# Patient Record
Sex: Female | Born: 1980 | Race: White | Hispanic: No | Marital: Married | Smoking: Never smoker
Health system: Southern US, Community
[De-identification: ages and names within clinical notes are randomized; demographics above are authoritative.]

## PROBLEM LIST (undated history)

## (undated) DIAGNOSIS — Z8619 Personal history of other infectious and parasitic diseases: Secondary | ICD-10-CM

## (undated) DIAGNOSIS — L409 Psoriasis, unspecified: Secondary | ICD-10-CM

## (undated) HISTORY — PX: WISDOM TOOTH EXTRACTION: SHX21

## (undated) HISTORY — DX: Personal history of other infectious and parasitic diseases: Z86.19

---

## 2003-06-30 ENCOUNTER — Other Ambulatory Visit: Admission: RE | Admit: 2003-06-30 | Discharge: 2003-06-30 | Payer: Self-pay | Admitting: Obstetrics and Gynecology

## 2005-03-21 ENCOUNTER — Other Ambulatory Visit: Admission: RE | Admit: 2005-03-21 | Discharge: 2005-03-21 | Payer: Self-pay | Admitting: Obstetrics and Gynecology

## 2009-12-22 ENCOUNTER — Emergency Department (HOSPITAL_BASED_OUTPATIENT_CLINIC_OR_DEPARTMENT_OTHER): Admission: EM | Admit: 2009-12-22 | Discharge: 2009-12-23 | Payer: Self-pay | Admitting: Emergency Medicine

## 2010-11-05 LAB — OB RESULTS CONSOLE HEPATITIS B SURFACE ANTIGEN: Hepatitis B Surface Ag: NEGATIVE

## 2010-11-05 LAB — OB RESULTS CONSOLE GC/CHLAMYDIA: Chlamydia: NEGATIVE

## 2010-11-05 LAB — OB RESULTS CONSOLE ANTIBODY SCREEN: Antibody Screen: NEGATIVE

## 2010-11-05 LAB — OB RESULTS CONSOLE RUBELLA ANTIBODY, IGM: Rubella: IMMUNE

## 2011-06-15 ENCOUNTER — Telehealth (HOSPITAL_COMMUNITY): Payer: Self-pay | Admitting: *Deleted

## 2011-06-15 ENCOUNTER — Encounter (HOSPITAL_COMMUNITY): Payer: Self-pay | Admitting: *Deleted

## 2011-06-15 NOTE — Telephone Encounter (Signed)
Preadmission screen  

## 2011-06-21 ENCOUNTER — Inpatient Hospital Stay (HOSPITAL_COMMUNITY): Admission: RE | Admit: 2011-06-21 | Payer: Self-pay | Source: Ambulatory Visit

## 2011-06-21 ENCOUNTER — Telehealth (HOSPITAL_COMMUNITY): Payer: Self-pay | Admitting: *Deleted

## 2011-06-21 NOTE — Telephone Encounter (Signed)
Preadmission screen  

## 2011-06-23 ENCOUNTER — Inpatient Hospital Stay (HOSPITAL_COMMUNITY): Admission: RE | Admit: 2011-06-23 | Payer: Self-pay | Source: Ambulatory Visit

## 2011-06-25 ENCOUNTER — Encounter (HOSPITAL_COMMUNITY): Payer: Self-pay | Admitting: *Deleted

## 2011-06-25 ENCOUNTER — Inpatient Hospital Stay (HOSPITAL_COMMUNITY)
Admission: AD | Admit: 2011-06-25 | Discharge: 2011-06-29 | DRG: 371 | Disposition: A | Discharge status: Home / Self Care | Payer: BC Managed Care – PPO | Source: Ambulatory Visit | Attending: Obstetrics and Gynecology | Admitting: Obstetrics and Gynecology

## 2011-06-25 DIAGNOSIS — O324XX0 Maternal care for high head at term, not applicable or unspecified: Secondary | ICD-10-CM | POA: Diagnosis present

## 2011-06-25 LAB — CBC
Hemoglobin: 12.4 g/dL (ref 12.0–15.0)
MCH: 30.7 pg (ref 26.0–34.0)
RBC: 4.04 MIL/uL (ref 3.87–5.11)
WBC: 15.5 10*3/uL — ABNORMAL HIGH (ref 4.0–10.5)

## 2011-06-25 LAB — RPR: RPR Ser Ql: NONREACTIVE

## 2011-06-25 MED ORDER — ONDANSETRON HCL 4 MG/2ML IJ SOLN
4.0000 mg | Freq: Four times a day (QID) | INTRAMUSCULAR | Status: DC | PRN
  Administered 2011-06-25: 4 mg via INTRAVENOUS
  Filled 2011-06-25: qty 2

## 2011-06-25 MED ORDER — OXYCODONE-ACETAMINOPHEN 5-325 MG PO TABS: 1.0000 | ORAL_TABLET | ORAL | Status: DC | PRN

## 2011-06-25 MED ORDER — CITRIC ACID-SODIUM CITRATE 334-500 MG/5ML PO SOLN
30.0000 mL | ORAL | Status: DC | PRN
  Administered 2011-06-26: 30 mL via ORAL
  Filled 2011-06-25: qty 15

## 2011-06-25 MED ORDER — PHENYLEPHRINE 40 MCG/ML (10ML) SYRINGE FOR IV PUSH (FOR BLOOD PRESSURE SUPPORT)
80.0000 ug | PREFILLED_SYRINGE | INTRAVENOUS | Status: DC | PRN
  Filled 2011-06-25: qty 5

## 2011-06-25 MED ORDER — LACTATED RINGERS IV SOLN: 500.0000 mL | INTRAVENOUS | Status: DC | PRN

## 2011-06-25 MED ORDER — FENTANYL 2.5 MCG/ML BUPIVACAINE 1/10 % EPIDURAL INFUSION (WH - ANES)
14.0000 mL/h | INTRAMUSCULAR | Status: DC
  Administered 2011-06-25 – 2011-06-26 (×3): 14 mL/h via EPIDURAL
  Filled 2011-06-25 (×4): qty 60

## 2011-06-25 MED ORDER — DIPHENHYDRAMINE HCL 50 MG/ML IJ SOLN: 12.5000 mg | INTRAMUSCULAR | Status: DC | PRN

## 2011-06-25 MED ORDER — EPHEDRINE 5 MG/ML INJ: 10.0000 mg | INTRAVENOUS | Status: DC | PRN

## 2011-06-25 MED ORDER — LIDOCAINE HCL (PF) 1 % IJ SOLN
30.0000 mL | INTRAMUSCULAR | Status: DC | PRN
  Filled 2011-06-25: qty 30

## 2011-06-25 MED ORDER — ACETAMINOPHEN 325 MG PO TABS: 650.0000 mg | ORAL_TABLET | ORAL | Status: DC | PRN

## 2011-06-25 MED ORDER — OXYTOCIN 20 UNITS IN LACTATED RINGERS INFUSION - SIMPLE: 125.0000 mL/h | Freq: Once | INTRAVENOUS | Status: DC

## 2011-06-25 MED ORDER — OXYTOCIN BOLUS FROM INFUSION
500.0000 mL | Freq: Once | INTRAVENOUS | Status: DC
  Filled 2011-06-25: qty 500
  Filled 2011-06-25: qty 1000

## 2011-06-25 MED ORDER — IBUPROFEN 600 MG PO TABS: 600.0000 mg | ORAL_TABLET | Freq: Four times a day (QID) | ORAL | Status: DC | PRN

## 2011-06-25 MED ORDER — EPHEDRINE 5 MG/ML INJ
10.0000 mg | INTRAVENOUS | Status: DC | PRN
  Filled 2011-06-25: qty 4

## 2011-06-25 MED ORDER — FLEET ENEMA 7-19 GM/118ML RE ENEM: 1.0000 | ENEMA | RECTAL | Status: DC | PRN

## 2011-06-25 MED ORDER — PHENYLEPHRINE 40 MCG/ML (10ML) SYRINGE FOR IV PUSH (FOR BLOOD PRESSURE SUPPORT): 80.0000 ug | PREFILLED_SYRINGE | INTRAVENOUS | Status: DC | PRN

## 2011-06-25 MED ORDER — FENTANYL 2.5 MCG/ML BUPIVACAINE 1/10 % EPIDURAL INFUSION (WH - ANES)
INTRAMUSCULAR | Status: DC | PRN
  Administered 2011-06-25: 14 mL/h via EPIDURAL

## 2011-06-25 MED ORDER — SODIUM BICARBONATE 8.4 % IV SOLN
INTRAVENOUS | Status: DC | PRN
  Administered 2011-06-25: 4 mL via EPIDURAL

## 2011-06-25 MED ORDER — LACTATED RINGERS IV SOLN
500.0000 mL | Freq: Once | INTRAVENOUS | Status: AC
  Administered 2011-06-25: 1000 mL via INTRAVENOUS

## 2011-06-25 MED ORDER — LACTATED RINGERS IV SOLN
INTRAVENOUS | Status: DC
  Administered 2011-06-25 (×2): via INTRAVENOUS

## 2011-06-25 NOTE — Progress Notes (Signed)
Dr Marcelle Overlie made aware of pt status: FHT tracing, uterine contraction pattern, pain level, intact membrane status, SVE and fetal station. Will continue to monitor.

## 2011-06-25 NOTE — Anesthesia Preprocedure Evaluation (Addendum)

## 2011-06-25 NOTE — Anesthesia Procedure Notes (Signed)

## 2011-06-25 NOTE — H&P (Signed)
Elizabeth Glass is a 31 y.o. female presenting for labor. Maternal Medical History:  Reason for admission: Reason for admission: contractions.  Contractions: Onset was 3-5 hours ago.   Frequency: regular.   Perceived severity is moderate.    Fetal activity: Perceived fetal activity is normal.      OB History    Grav Para Term Preterm Abortions TAB SAB Ect Mult Living   1              Past Medical History  Diagnosis Date  . History of chicken pox    Past Surgical History  Procedure Date  . Wisdom tooth extraction    Family History: family history includes Celiac disease in her paternal aunt. Social History:  reports that she has never smoked. She has never used smokeless tobacco. She reports that she does not drink alcohol or use illicit drugs.  ROS  Dilation: 4.5 Effacement (%): 100 Station: 0 Exam by:: k fields, rn Blood pressure 136/89, pulse 82, temperature 98.6 F (37 C), temperature source Oral, resp. rate 20, height 5\' 4"  (1.626 m), weight 151 lb (68.493 kg), last menstrual period 09/10/2010. Maternal Exam:  Uterine Assessment: Contraction strength is moderate.  Contraction frequency is regular.   Abdomen: Fundal height is term FH.   Estimated fetal weight is AGA.   Fetal presentation: vertex  Introitus: Normal vulva. Normal vagina.  Pelvis: adequate for delivery.   Cervix: Cervix evaluated by digital exam.     Physical Exam  Constitutional: She is oriented to person, place, and time. She appears well-developed and well-nourished.  HENT:  Head: Normocephalic and atraumatic.  Neck: Normal range of motion. Neck supple.  Cardiovascular: Normal rate and regular rhythm.   Respiratory: Effort normal and breath sounds normal.  GI:       Term FH, FHR 146  Genitourinary:       cx 4-5  Musculoskeletal: Normal range of motion.  Neurological: She is alert and oriented to person, place, and time.    Prenatal labs: ABO, Rh: O/Positive/-- (09/28 0000) Antibody:  Negative (09/28 0000) Rubella: Immune (09/28 0000) RPR: Nonreactive (09/28 0000)  HBsAg: Negative (09/28 0000)  HIV: Non-reactive (09/28 0000)  GBS: Negative (04/15 0000)   Assessment/Plan: Term IUP, labor, for epidural   Mickel Schreur M 06/25/2011, 3:22 PM

## 2011-06-26 ENCOUNTER — Encounter (HOSPITAL_COMMUNITY): Payer: Self-pay | Admitting: *Deleted

## 2011-06-26 ENCOUNTER — Inpatient Hospital Stay (HOSPITAL_COMMUNITY): Payer: BC Managed Care – PPO | Admitting: Anesthesiology

## 2011-06-26 ENCOUNTER — Encounter (HOSPITAL_COMMUNITY)
Admission: AD | Disposition: A | Discharge status: Home / Self Care | Payer: Self-pay | Source: Ambulatory Visit | Attending: Obstetrics and Gynecology

## 2011-06-26 ENCOUNTER — Encounter (HOSPITAL_COMMUNITY): Payer: Self-pay | Admitting: Anesthesiology

## 2011-06-26 SURGERY — Surgical Case
Anesthesia: Epidural | Site: Abdomen | Wound class: Clean Contaminated

## 2011-06-26 MED ORDER — DIPHENHYDRAMINE HCL 50 MG/ML IJ SOLN: 12.5000 mg | INTRAMUSCULAR | Status: DC | PRN

## 2011-06-26 MED ORDER — ONDANSETRON HCL 4 MG/2ML IJ SOLN
INTRAMUSCULAR | Status: AC
  Filled 2011-06-26: qty 4

## 2011-06-26 MED ORDER — METOCLOPRAMIDE HCL 5 MG/ML IJ SOLN: 10.0000 mg | Freq: Three times a day (TID) | INTRAMUSCULAR | Status: DC | PRN

## 2011-06-26 MED ORDER — 0.9 % SODIUM CHLORIDE (POUR BTL) OPTIME
TOPICAL | Status: DC | PRN
  Administered 2011-06-26: 1000 mL

## 2011-06-26 MED ORDER — PHENYLEPHRINE HCL 10 MG/ML IJ SOLN
INTRAMUSCULAR | Status: DC | PRN
  Administered 2011-06-26: 40 ug via INTRAVENOUS
  Administered 2011-06-26: 80 ug via INTRAVENOUS

## 2011-06-26 MED ORDER — KETOROLAC TROMETHAMINE 30 MG/ML IJ SOLN: 15.0000 mg | Freq: Once | INTRAMUSCULAR | Status: DC | PRN

## 2011-06-26 MED ORDER — ZOLPIDEM TARTRATE 5 MG PO TABS: 5.0000 mg | ORAL_TABLET | Freq: Every evening | ORAL | Status: DC | PRN

## 2011-06-26 MED ORDER — TETANUS-DIPHTH-ACELL PERTUSSIS 5-2.5-18.5 LF-MCG/0.5 IM SUSP: 0.5000 mL | Freq: Once | INTRAMUSCULAR | Status: DC

## 2011-06-26 MED ORDER — OXYTOCIN 10 UNIT/ML IJ SOLN
INTRAMUSCULAR | Status: AC
  Filled 2011-06-26: qty 8

## 2011-06-26 MED ORDER — NALBUPHINE HCL 10 MG/ML IJ SOLN
5.0000 mg | INTRAMUSCULAR | Status: DC | PRN
  Filled 2011-06-26: qty 1

## 2011-06-26 MED ORDER — KETOROLAC TROMETHAMINE 30 MG/ML IJ SOLN: 30.0000 mg | Freq: Four times a day (QID) | INTRAMUSCULAR | Status: AC | PRN

## 2011-06-26 MED ORDER — SODIUM CHLORIDE 0.9 % IJ SOLN: 3.0000 mL | INTRAMUSCULAR | Status: DC | PRN

## 2011-06-26 MED ORDER — DIBUCAINE 1 % RE OINT: 1.0000 "application " | TOPICAL_OINTMENT | RECTAL | Status: DC | PRN

## 2011-06-26 MED ORDER — BISACODYL 10 MG RE SUPP: 10.0000 mg | Freq: Every day | RECTAL | Status: DC | PRN

## 2011-06-26 MED ORDER — KETOROLAC TROMETHAMINE 60 MG/2ML IM SOLN
60.0000 mg | Freq: Once | INTRAMUSCULAR | Status: AC | PRN
  Administered 2011-06-26: 60 mg via INTRAMUSCULAR

## 2011-06-26 MED ORDER — LIDOCAINE-EPINEPHRINE (PF) 2 %-1:200000 IJ SOLN
INTRAMUSCULAR | Status: AC
  Filled 2011-06-26: qty 20

## 2011-06-26 MED ORDER — IBUPROFEN 800 MG PO TABS
800.0000 mg | ORAL_TABLET | Freq: Three times a day (TID) | ORAL | Status: DC | PRN
  Administered 2011-06-26 – 2011-06-29 (×9): 800 mg via ORAL
  Filled 2011-06-26 (×9): qty 1

## 2011-06-26 MED ORDER — DIPHENHYDRAMINE HCL 50 MG/ML IJ SOLN: 25.0000 mg | INTRAMUSCULAR | Status: DC | PRN

## 2011-06-26 MED ORDER — LACTATED RINGERS IV SOLN: INTRAVENOUS | Status: DC | PRN

## 2011-06-26 MED ORDER — MEPERIDINE HCL 25 MG/ML IJ SOLN
INTRAMUSCULAR | Status: DC | PRN
  Administered 2011-06-26 (×2): 12.5 mg via INTRAVENOUS

## 2011-06-26 MED ORDER — MENTHOL 3 MG MT LOZG: 1.0000 | LOZENGE | OROMUCOSAL | Status: DC | PRN

## 2011-06-26 MED ORDER — OXYCODONE-ACETAMINOPHEN 5-325 MG PO TABS
1.0000 | ORAL_TABLET | Freq: Four times a day (QID) | ORAL | Status: DC | PRN
  Administered 2011-06-26 – 2011-06-29 (×11): 1 via ORAL
  Filled 2011-06-26 (×11): qty 1

## 2011-06-26 MED ORDER — PRENATAL MULTIVITAMIN CH
1.0000 | ORAL_TABLET | Freq: Every day | ORAL | Status: DC
  Administered 2011-06-27: 1 via ORAL
  Filled 2011-06-26 (×2): qty 1

## 2011-06-26 MED ORDER — FENTANYL CITRATE 0.05 MG/ML IJ SOLN
INTRAMUSCULAR | Status: AC
  Filled 2011-06-26: qty 4

## 2011-06-26 MED ORDER — SODIUM CHLORIDE 0.9 % IV SOLN
1.0000 ug/kg/h | INTRAVENOUS | Status: DC | PRN
  Filled 2011-06-26: qty 2.5

## 2011-06-26 MED ORDER — SODIUM BICARBONATE 8.4 % IV SOLN
INTRAVENOUS | Status: AC
  Filled 2011-06-26: qty 50

## 2011-06-26 MED ORDER — OXYTOCIN 10 UNIT/ML IJ SOLN
INTRAMUSCULAR | Status: DC | PRN
  Administered 2011-06-26 (×2): 20 [IU] via INTRAMUSCULAR

## 2011-06-26 MED ORDER — MORPHINE SULFATE (PF) 0.5 MG/ML IJ SOLN
INTRAMUSCULAR | Status: DC | PRN
  Administered 2011-06-26: 2 mg via INTRAVENOUS

## 2011-06-26 MED ORDER — HYDROMORPHONE HCL PF 1 MG/ML IJ SOLN: 0.2500 mg | INTRAMUSCULAR | Status: DC | PRN

## 2011-06-26 MED ORDER — WITCH HAZEL-GLYCERIN EX PADS: 1.0000 "application " | MEDICATED_PAD | CUTANEOUS | Status: DC | PRN

## 2011-06-26 MED ORDER — ONDANSETRON HCL 4 MG/2ML IJ SOLN: 4.0000 mg | INTRAMUSCULAR | Status: DC | PRN

## 2011-06-26 MED ORDER — FENTANYL CITRATE 0.05 MG/ML IJ SOLN
INTRAMUSCULAR | Status: DC | PRN
  Administered 2011-06-26 (×2): 50 ug via INTRAVENOUS

## 2011-06-26 MED ORDER — SIMETHICONE 80 MG PO CHEW: 80.0000 mg | CHEWABLE_TABLET | ORAL | Status: DC | PRN

## 2011-06-26 MED ORDER — MORPHINE SULFATE (PF) 0.5 MG/ML IJ SOLN
INTRAMUSCULAR | Status: DC | PRN
  Administered 2011-06-26: 3 mg via EPIDURAL

## 2011-06-26 MED ORDER — MEPERIDINE HCL 25 MG/ML IJ SOLN
INTRAMUSCULAR | Status: AC
  Filled 2011-06-26: qty 1

## 2011-06-26 MED ORDER — MEASLES, MUMPS & RUBELLA VAC ~~LOC~~ INJ
0.5000 mL | INJECTION | Freq: Once | SUBCUTANEOUS | Status: DC
  Filled 2011-06-26: qty 0.5

## 2011-06-26 MED ORDER — DIPHENHYDRAMINE HCL 25 MG PO CAPS: 25.0000 mg | ORAL_CAPSULE | Freq: Four times a day (QID) | ORAL | Status: DC | PRN

## 2011-06-26 MED ORDER — NALBUPHINE HCL 10 MG/ML IJ SOLN
5.0000 mg | INTRAMUSCULAR | Status: DC | PRN
Start: 2011-06-26 — End: 2011-06-29
  Filled 2011-06-26: qty 1

## 2011-06-26 MED ORDER — SCOPOLAMINE 1 MG/3DAYS TD PT72: 1.0000 | MEDICATED_PATCH | Freq: Once | TRANSDERMAL | Status: DC

## 2011-06-26 MED ORDER — LANOLIN HYDROUS EX OINT: 1.0000 "application " | TOPICAL_OINTMENT | CUTANEOUS | Status: DC | PRN

## 2011-06-26 MED ORDER — PROMETHAZINE HCL 25 MG/ML IJ SOLN: 6.2500 mg | INTRAMUSCULAR | Status: DC | PRN

## 2011-06-26 MED ORDER — SENNOSIDES-DOCUSATE SODIUM 8.6-50 MG PO TABS
2.0000 | ORAL_TABLET | Freq: Every day | ORAL | Status: DC
  Administered 2011-06-26 – 2011-06-28 (×3): 2 via ORAL

## 2011-06-26 MED ORDER — ONDANSETRON HCL 4 MG PO TABS: 4.0000 mg | ORAL_TABLET | ORAL | Status: DC | PRN

## 2011-06-26 MED ORDER — OXYTOCIN 20 UNITS IN LACTATED RINGERS INFUSION - SIMPLE: 125.0000 mL/h | INTRAVENOUS | Status: AC

## 2011-06-26 MED ORDER — FLEET ENEMA 7-19 GM/118ML RE ENEM: 1.0000 | ENEMA | Freq: Every day | RECTAL | Status: DC | PRN

## 2011-06-26 MED ORDER — KETOROLAC TROMETHAMINE 60 MG/2ML IM SOLN
INTRAMUSCULAR | Status: AC
  Filled 2011-06-26: qty 2

## 2011-06-26 MED ORDER — CEFAZOLIN SODIUM 1-5 GM-% IV SOLN
1.0000 g | Freq: Once | INTRAVENOUS | Status: AC
  Administered 2011-06-26: 1 g via INTRAVENOUS
  Filled 2011-06-26: qty 50

## 2011-06-26 MED ORDER — MORPHINE SULFATE 0.5 MG/ML IJ SOLN
INTRAMUSCULAR | Status: AC
  Filled 2011-06-26: qty 20

## 2011-06-26 MED ORDER — LACTATED RINGERS IV SOLN
INTRAVENOUS | Status: DC | PRN
  Administered 2011-06-26 (×3): via INTRAVENOUS

## 2011-06-26 MED ORDER — CEFAZOLIN SODIUM 1-5 GM-% IV SOLN
INTRAVENOUS | Status: AC
  Filled 2011-06-26: qty 50

## 2011-06-26 MED ORDER — ONDANSETRON HCL 4 MG/2ML IJ SOLN: 4.0000 mg | Freq: Three times a day (TID) | INTRAMUSCULAR | Status: DC | PRN

## 2011-06-26 MED ORDER — SODIUM CHLORIDE 0.9 % IJ SOLN
3.0000 mL | INTRAMUSCULAR | Status: DC | PRN
  Administered 2011-06-26: 3 mL via INTRAVENOUS

## 2011-06-26 MED ORDER — PHENYLEPHRINE 40 MCG/ML (10ML) SYRINGE FOR IV PUSH (FOR BLOOD PRESSURE SUPPORT)
PREFILLED_SYRINGE | INTRAVENOUS | Status: AC
  Filled 2011-06-26: qty 20

## 2011-06-26 MED ORDER — MEPERIDINE HCL 25 MG/ML IJ SOLN: 6.2500 mg | INTRAMUSCULAR | Status: DC | PRN

## 2011-06-26 MED ORDER — DIPHENHYDRAMINE HCL 25 MG PO CAPS
25.0000 mg | ORAL_CAPSULE | ORAL | Status: DC | PRN
  Filled 2011-06-26: qty 1

## 2011-06-26 MED ORDER — SODIUM CHLORIDE 0.9 % IV SOLN
250.0000 mL | INTRAVENOUS | Status: DC
Start: 2011-06-26 — End: 2011-06-29

## 2011-06-26 MED ORDER — NALOXONE HCL 0.4 MG/ML IJ SOLN: 0.4000 mg | INTRAMUSCULAR | Status: DC | PRN

## 2011-06-26 MED ORDER — SODIUM CHLORIDE 0.9 % IJ SOLN
3.0000 mL | Freq: Two times a day (BID) | INTRAMUSCULAR | Status: DC
  Administered 2011-06-26: 3 mL via INTRAVENOUS

## 2011-06-26 MED ORDER — SIMETHICONE 80 MG PO CHEW
80.0000 mg | CHEWABLE_TABLET | Freq: Three times a day (TID) | ORAL | Status: DC
  Administered 2011-06-26 – 2011-06-28 (×9): 80 mg via ORAL

## 2011-06-26 SURGICAL SUPPLY — 26 items
CLOTH BEACON ORANGE TIMEOUT ST (SAFETY) ×2 IMPLANT
DRESSING TELFA 8X3 (GAUZE/BANDAGES/DRESSINGS) IMPLANT
DRSG COVADERM 4X10 (GAUZE/BANDAGES/DRESSINGS) ×1 IMPLANT
ELECT REM PT RETURN 9FT ADLT (ELECTROSURGICAL) ×2
ELECTRODE REM PT RTRN 9FT ADLT (ELECTROSURGICAL) ×1 IMPLANT
EXTRACTOR VACUUM M CUP 4 TUBE (SUCTIONS) IMPLANT
GAUZE SPONGE 4X4 12PLY STRL LF (GAUZE/BANDAGES/DRESSINGS) ×2 IMPLANT
GLOVE BIO SURGEON STRL SZ7 (GLOVE) ×4 IMPLANT
GOWN PREVENTION PLUS LG XLONG (DISPOSABLE) ×6 IMPLANT
KIT ABG SYR 3ML LUER SLIP (SYRINGE) ×1 IMPLANT
NDL HYPO 25X5/8 SAFETYGLIDE (NEEDLE) ×1 IMPLANT
NEEDLE HYPO 25X5/8 SAFETYGLIDE (NEEDLE) ×2 IMPLANT
NS IRRIG 1000ML POUR BTL (IV SOLUTION) ×2 IMPLANT
PACK C SECTION WH (CUSTOM PROCEDURE TRAY) ×2 IMPLANT
PAD ABD 7.5X8 STRL (GAUZE/BANDAGES/DRESSINGS) ×2 IMPLANT
SLEEVE SCD COMPRESS KNEE MED (MISCELLANEOUS) ×1 IMPLANT
STRIP CLOSURE SKIN 1/2X4 (GAUZE/BANDAGES/DRESSINGS) ×1 IMPLANT
SUT CHROMIC 0 CTX 36 (SUTURE) ×6 IMPLANT
SUT MON AB 4-0 PS1 27 (SUTURE) ×2 IMPLANT
SUT PDS AB 0 CT1 27 (SUTURE) ×4 IMPLANT
SUT VIC AB 3-0 CT1 27 (SUTURE) ×4
SUT VIC AB 3-0 CT1 TAPERPNT 27 (SUTURE) ×2 IMPLANT
TAPE CLOTH SURG 4X10 WHT LF (GAUZE/BANDAGES/DRESSINGS) ×1 IMPLANT
TOWEL OR 17X24 6PK STRL BLUE (TOWEL DISPOSABLE) ×4 IMPLANT
TRAY FOLEY CATH 14FR (SET/KITS/TRAYS/PACK) ×1 IMPLANT
WATER STERILE IRR 1000ML POUR (IV SOLUTION) ×2 IMPLANT

## 2011-06-26 NOTE — Anesthesia Postprocedure Evaluation (Signed)
  Anesthesia Post-op Note  Patient: Elizabeth Glass  Procedure(s) Performed: Procedure(s) (LRB): CESAREAN SECTION (N/A)  Patient Location: PACU and Mother/Baby  Anesthesia Type: Epidural  Level of Consciousness: awake, alert  and oriented  Airway and Oxygen Therapy: Patient Spontanous Breathing   Post-op Assessment: Patient's Cardiovascular Status Stable and Respiratory Function Stable  Post-op Vital Signs: stable  Complications: No apparent anesthesia complications

## 2011-06-26 NOTE — Progress Notes (Signed)
Nursery called to see if baby and daddy could come for a visit, asked mom who states she is still very tired and wants to hold off of seeing baby right now, husband in to see pt and update given, will continue to monitor and manage, encouraged skin to skin in nursery with father.

## 2011-06-26 NOTE — Op Note (Signed)
Preoperative diagnosis: Failure to descend  Postoperative diagnosis: Same  Procedure: Primary low transverse cesarean section  Surgeon: Marcelle Overlie  EBL: 700 cc  Complications: None  Specimens removed: Placenta to labor and delivery  Procedure and findings:  Patient was taken to the operating room after an adequate level of epidural anesthesia was obtained surgeon's hand used to elevate the vertex out of the pelvis preoperatively. Appropriate time out was performed she did receive Ancef 1 g IV preop Pfannenstiel incision was made 2 finger breaths above the symphysis carried down to the fascia which was incised and extended transversely. Rectus muscle divided in the midline peritoneum entered superiorly without incident and extended in a vertical fashion. The vesicouterine serosa was incised and the bladder was bluntly and sharply dissected below, bladder blade repositioned. Transverse incision made in the lower segment extended with blunt dissection clear fluid noted the patient then delivered of a healthy female Apgars 9 and 9 the infant was suctioned cord clamped and passed the pediatric team for further care cord pH was sent placenta was removed manually intact, uterus exteriorized cavity wiped clean with a laparotomy pack closure obtained the first layer of 0 chromic in a locked fashion followed by an imbricating layer of chromic. This was hemostatic the bladder flap area was intact and hemostatic. Bilateral tubes and ovaries normal. Prior to closure sponge needle instrument counts reported as correct x2. Peritoneum closed with a running 2-0 Vicryl suture. Fascia closed from laterally to midline on either side with a 0 PDS suture. Subcutaneous tissue was minimal and hemostatic 4-0 Monocryl subcuticular closure. Mother and baby doing well at that point.  Dictated with [facility]  Porter Moes M. Milana Obey.D.

## 2011-06-26 NOTE — Progress Notes (Signed)
Pushing ~ 2 hrs +, now +2, LOA with stable FHR, does have descent with pushing effort, she is still willing to push

## 2011-06-26 NOTE — OR Nursing (Signed)
Uterus massaged by S. Paradise Vensel RN. Two tubes of cord blood to lab. Foley catheter in place upon arrival to OR. Urine color-slightly concentrated. 

## 2011-06-26 NOTE — Anesthesia Postprocedure Evaluation (Signed)
Anesthesia Post Note  Patient: Elizabeth Glass  Procedure(s) Performed: Procedure(s) (LRB): CESAREAN SECTION (N/A)  Anesthesia type: Epidural  Patient location: PACU  Post pain: Pain level controlled  Post assessment: Post-op Vital signs reviewed  Last Vitals:  Filed Vitals:   06/26/11 0830  BP: 122/66  Pulse: 79  Temp:   Resp: 19    Post vital signs: Reviewed  Level of consciousness: awake  Complications: No apparent anesthesia complications

## 2011-06-26 NOTE — Transfer of Care (Signed)
Immediate Anesthesia Transfer of Care Note  Patient: Elizabeth Glass  Procedure(s) Performed: Procedure(s) (LRB): CESAREAN SECTION (N/A)  Patient Location: PACU  Anesthesia Type: Epidural  Level of Consciousness: awake, alert  and oriented  Airway & Oxygen Therapy: Patient Spontanous Breathing  Post-op Assessment: Report given to PACU RN and Post -op Vital signs reviewed and stable  Post vital signs: stable  Complications: No apparent anesthesia complications

## 2011-06-26 NOTE — Progress Notes (Signed)
AROM>>clear AF, complete. LOA @ 0-+1, will start pushing when pt gets sensation, stable FHR

## 2011-06-26 NOTE — Progress Notes (Signed)
MD made aware of pushing time, SVE, including station and progression. FHT tracing and uterine contraction pattern. Request to evaluate in person.

## 2011-06-26 NOTE — Progress Notes (Signed)
Rechecked, no change past +2 with ^ caput, rec CS for failure to descend, procedure reviewed

## 2011-06-26 NOTE — Progress Notes (Signed)
In or via stretcher

## 2011-06-26 NOTE — Addendum Note (Signed)
Addendum  created 06/26/11 1920 by Len Blalock, CRNA   Modules edited:Notes Section

## 2011-06-26 NOTE — Progress Notes (Signed)
POC discussed with pt

## 2011-06-27 ENCOUNTER — Inpatient Hospital Stay (HOSPITAL_COMMUNITY): Admission: RE | Admit: 2011-06-27 | Payer: BC Managed Care – PPO | Source: Ambulatory Visit

## 2011-06-27 ENCOUNTER — Encounter (HOSPITAL_COMMUNITY): Payer: Self-pay | Admitting: Obstetrics and Gynecology

## 2011-06-27 LAB — CBC
HCT: 29.2 % — ABNORMAL LOW (ref 36.0–46.0)
MCH: 30.5 pg (ref 26.0–34.0)
MCV: 91 fL (ref 78.0–100.0)
RBC: 3.21 MIL/uL — ABNORMAL LOW (ref 3.87–5.11)
RDW: 14.1 % (ref 11.5–15.5)
WBC: 14.3 10*3/uL — ABNORMAL HIGH (ref 4.0–10.5)

## 2011-06-27 NOTE — Progress Notes (Signed)
Subjective: Postpartum Day 1: Cesarean Delivery Patient reports tolerating PO, + flatus and no problems voiding.    Objective: Vital signs in last 24 hours: Temp:  [97.7 F (36.5 C)-99 F (37.2 C)] 98.5 F (36.9 C) (05/20 0640) Pulse Rate:  [68-112] 81  (05/20 0640) Resp:  [12-22] 18  (05/20 0640) BP: (96-144)/(51-87) 111/71 mmHg (05/20 0640) SpO2:  [95 %-100 %] 98 % (05/20 0640)  Physical Exam:  General: alert and cooperative Lochia: appropriate Uterine Fundus: firm Incision: abd dressing CDI  DVT Evaluation: No evidence of DVT seen on physical exam.   Basename 06/27/11 0524 06/25/11 1543  HGB 9.8* 12.4  HCT 29.2* 35.6*    Assessment/Plan: Status post Cesarean section. Doing well postoperatively.  Continue current care.  Danarius Mcconathy G 06/27/2011, 7:55 AM

## 2011-06-28 NOTE — Progress Notes (Signed)
Subjective: Postpartum Day 2: Cesarean Delivery Patient reports tolerating PO, + flatus and no problems voiding.    Objective: Vital signs in last 24 hours: Temp:  [97.7 F (36.5 C)-97.9 F (36.6 C)] 97.7 F (36.5 C) (05/21 1610) Pulse Rate:  [85-108] 85  (05/21 0632) Resp:  [18-20] 20  (05/21 9604) BP: (120-125)/(75-79) 125/79 mmHg (05/21 5409)  Physical Exam:  General: alert and cooperative Lochia: appropriate Uterine Fundus: firm Incision: healing well DVT Evaluation: No evidence of DVT seen on physical exam.   Basename 06/27/11 0524 06/25/11 1543  HGB 9.8* 12.4  HCT 29.2* 35.6*    Assessment/Plan: Status post Cesarean section. Doing well postoperatively.  Continue current care.  CURTIS,CAROL G 06/28/2011, 8:28 AM

## 2011-06-29 MED ORDER — OXYCODONE-ACETAMINOPHEN 5-325 MG PO TABS: 1.0000 | ORAL_TABLET | Freq: Four times a day (QID) | ORAL | Status: AC | PRN

## 2011-06-29 MED ORDER — IBUPROFEN 800 MG PO TABS: 800.0000 mg | ORAL_TABLET | Freq: Three times a day (TID) | ORAL | Status: AC | PRN

## 2011-06-29 NOTE — Discharge Summary (Signed)
Obstetric Discharge Summary Reason for Admission: onset of labor Prenatal Procedures: ultrasound Intrapartum Procedures: cesarean: low cervical, transverse Postpartum Procedures: none Complications-Operative and Postpartum: none Hemoglobin  Date Value Range Status  06/27/2011 9.8* 12.0-15.0 (g/dL) Final     DELTA CHECK NOTED     REPEATED TO VERIFY     HCT  Date Value Range Status  06/27/2011 29.2* 36.0-46.0 (%) Final    Physical Exam:  General: alert and cooperative Lochia: appropriate Uterine Fundus: firm Incision: healing well DVT Evaluation: No evidence of DVT seen on physical exam.  Discharge Diagnoses: Term Pregnancy-delivered  Discharge Information: Date: 06/29/2011 Activity: pelvic rest Diet: routine Medications: PNV, Ibuprofen and Percocet Condition: stable Instructions: refer to practice specific booklet Discharge to: home   Newborn Data: Live born female  Birth Weight: 6 lb 12.8 oz (3085 g) APGAR: 9, 9  Home with mother.  Aldine Chakraborty G 06/29/2011, 8:37 AM

## 2011-06-29 NOTE — Progress Notes (Signed)
Subjective: Postpartum Day 3: Cesarean Delivery Patient reports tolerating PO, + flatus and no problems voiding.    Objective: Vital signs in last 24 hours: Temp:  [97.4 F (36.3 C)-98.1 F (36.7 C)] 98 F (36.7 C) (05/22 0559) Pulse Rate:  [63-94] 63  (05/22 0559) Resp:  [16-18] 18  (05/22 0559) BP: (118-139)/(73-82) 139/82 mmHg (05/22 0559) SpO2:  [99 %] 99 % (05/21 2234)  Physical Exam:  General: alert and cooperative Lochia: appropriate Uterine Fundus: firm Incision: healing well DVT Evaluation: No evidence of DVT seen on physical exam.   Basename 06/27/11 0524  HGB 9.8*  HCT 29.2*    Assessment/Plan: Status post Cesarean section. Doing well postoperatively.  Discharge home with standard precautions and return to clinic in 1 week.  Shivank Pinedo G 06/29/2011, 8:28 AM

## 2013-05-16 LAB — OB RESULTS CONSOLE HEPATITIS B SURFACE ANTIGEN: Hepatitis B Surface Ag: NEGATIVE

## 2013-05-16 LAB — OB RESULTS CONSOLE GC/CHLAMYDIA
Chlamydia: NEGATIVE
Gonorrhea: NEGATIVE

## 2013-05-16 LAB — OB RESULTS CONSOLE RUBELLA ANTIBODY, IGM: Rubella: UNDETERMINED

## 2013-10-20 ENCOUNTER — Encounter (HOSPITAL_COMMUNITY): Payer: Self-pay | Admitting: *Deleted

## 2013-10-20 ENCOUNTER — Inpatient Hospital Stay (HOSPITAL_COMMUNITY)
Admission: AD | Admit: 2013-10-20 | Discharge: 2013-10-25 | DRG: 765 | Disposition: A | Discharge status: Home / Self Care | Payer: BC Managed Care – PPO | Source: Ambulatory Visit | Attending: Obstetrics and Gynecology | Admitting: Obstetrics and Gynecology

## 2013-10-20 ENCOUNTER — Inpatient Hospital Stay (HOSPITAL_COMMUNITY): Payer: BC Managed Care – PPO

## 2013-10-20 DIAGNOSIS — O42919 Preterm premature rupture of membranes, unspecified as to length of time between rupture and onset of labor, unspecified trimester: Secondary | ICD-10-CM | POA: Diagnosis present

## 2013-10-20 DIAGNOSIS — O4290 Premature rupture of membranes, unspecified as to length of time between rupture and onset of labor, unspecified weeks of gestation: Secondary | ICD-10-CM | POA: Diagnosis present

## 2013-10-20 DIAGNOSIS — O3090 Multiple gestation, unspecified, unspecified trimester: Secondary | ICD-10-CM | POA: Diagnosis present

## 2013-10-20 DIAGNOSIS — O09529 Supervision of elderly multigravida, unspecified trimester: Secondary | ICD-10-CM | POA: Diagnosis present

## 2013-10-20 DIAGNOSIS — O329XX0 Maternal care for malpresentation of fetus, unspecified, not applicable or unspecified: Secondary | ICD-10-CM

## 2013-10-20 DIAGNOSIS — O30003 Twin pregnancy, unspecified number of placenta and unspecified number of amniotic sacs, third trimester: Secondary | ICD-10-CM

## 2013-10-20 DIAGNOSIS — O30009 Twin pregnancy, unspecified number of placenta and unspecified number of amniotic sacs, unspecified trimester: Secondary | ICD-10-CM | POA: Diagnosis present

## 2013-10-20 DIAGNOSIS — O34219 Maternal care for unspecified type scar from previous cesarean delivery: Secondary | ICD-10-CM | POA: Diagnosis present

## 2013-10-20 LAB — CBC
HCT: 35.5 % — ABNORMAL LOW (ref 36.0–46.0)
Hemoglobin: 12.5 g/dL (ref 12.0–15.0)
MCH: 31.7 pg (ref 26.0–34.0)
MCHC: 35.2 g/dL (ref 30.0–36.0)
MCV: 90.1 fL (ref 78.0–100.0)
PLATELETS: 178 10*3/uL (ref 150–400)
RBC: 3.94 MIL/uL (ref 3.87–5.11)
RDW: 14.8 % (ref 11.5–15.5)
WBC: 10 10*3/uL (ref 4.0–10.5)

## 2013-10-20 LAB — HIV ANTIBODY (ROUTINE TESTING W REFLEX): HIV 1&2 Ab, 4th Generation: NONREACTIVE

## 2013-10-20 LAB — TYPE AND SCREEN
ABO/RH(D): O POS
ANTIBODY SCREEN: NEGATIVE

## 2013-10-20 LAB — DIFFERENTIAL
Basophils Absolute: 0 10*3/uL (ref 0.0–0.1)
Basophils Relative: 0 % (ref 0–1)
EOS PCT: 1 % (ref 0–5)
Eosinophils Absolute: 0.1 10*3/uL (ref 0.0–0.7)
LYMPHS ABS: 2 10*3/uL (ref 0.7–4.0)
LYMPHS PCT: 20 % (ref 12–46)
Monocytes Absolute: 0.8 10*3/uL (ref 0.1–1.0)
Monocytes Relative: 8 % (ref 3–12)
NEUTROS ABS: 7.1 10*3/uL (ref 1.7–7.7)
Neutrophils Relative %: 71 % (ref 43–77)

## 2013-10-20 LAB — URINALYSIS, ROUTINE W REFLEX MICROSCOPIC
Bilirubin Urine: NEGATIVE
GLUCOSE, UA: NEGATIVE mg/dL
KETONES UR: NEGATIVE mg/dL
NITRITE: NEGATIVE
PROTEIN: 100 mg/dL — AB
Specific Gravity, Urine: 1.015 (ref 1.005–1.030)
UROBILINOGEN UA: 0.2 mg/dL (ref 0.0–1.0)
pH: 7.5 (ref 5.0–8.0)

## 2013-10-20 LAB — ABO/RH: ABO/RH(D): O POS

## 2013-10-20 LAB — POCT FERN TEST: POCT Fern Test: POSITIVE

## 2013-10-20 LAB — URINE MICROSCOPIC-ADD ON

## 2013-10-20 LAB — RPR

## 2013-10-20 MED ORDER — MAGNESIUM SULFATE BOLUS VIA INFUSION
4.0000 g | Freq: Once | INTRAVENOUS | Status: AC
  Administered 2013-10-20: 4 g via INTRAVENOUS
  Filled 2013-10-20: qty 500

## 2013-10-20 MED ORDER — ZOLPIDEM TARTRATE 5 MG PO TABS: 5.0000 mg | ORAL_TABLET | Freq: Every evening | ORAL | Status: DC | PRN

## 2013-10-20 MED ORDER — AZITHROMYCIN 500 MG PO TABS
500.0000 mg | ORAL_TABLET | Freq: Every day | ORAL | Status: DC
  Administered 2013-10-20 – 2013-10-21 (×2): 500 mg via ORAL
  Filled 2013-10-20: qty 2
  Filled 2013-10-20: qty 1
  Filled 2013-10-20: qty 2

## 2013-10-20 MED ORDER — DEXTROSE IN LACTATED RINGERS 5 % IV SOLN
INTRAVENOUS | Status: DC
  Administered 2013-10-20 – 2013-10-21 (×3): via INTRAVENOUS

## 2013-10-20 MED ORDER — CALCIUM CARBONATE ANTACID 500 MG PO CHEW
2.0000 | CHEWABLE_TABLET | ORAL | Status: DC | PRN
  Filled 2013-10-20: qty 2

## 2013-10-20 MED ORDER — MAGNESIUM SULFATE 40 G IN LACTATED RINGERS - SIMPLE
2.5000 g/h | INTRAVENOUS | Status: DC
  Administered 2013-10-21: 2.5 g/h via INTRAVENOUS
  Filled 2013-10-20 (×2): qty 500

## 2013-10-20 MED ORDER — ACETAMINOPHEN 325 MG PO TABS
650.0000 mg | ORAL_TABLET | ORAL | Status: DC | PRN
  Administered 2013-10-22 – 2013-10-23 (×4): 650 mg via ORAL
  Filled 2013-10-20 (×4): qty 2

## 2013-10-20 MED ORDER — PRENATAL MULTIVITAMIN CH
1.0000 | ORAL_TABLET | Freq: Every day | ORAL | Status: DC
  Administered 2013-10-21 – 2013-10-23 (×3): 1 via ORAL
  Filled 2013-10-20 (×6): qty 1

## 2013-10-20 MED ORDER — AMOXICILLIN 500 MG PO CAPS
500.0000 mg | ORAL_CAPSULE | Freq: Three times a day (TID) | ORAL | Status: DC
  Filled 2013-10-20 (×4): qty 1

## 2013-10-20 MED ORDER — SODIUM CHLORIDE 0.9 % IV SOLN
2.0000 g | Freq: Four times a day (QID) | INTRAVENOUS | Status: AC
  Administered 2013-10-20 – 2013-10-22 (×8): 2 g via INTRAVENOUS
  Filled 2013-10-20 (×8): qty 2000

## 2013-10-20 MED ORDER — DOCUSATE SODIUM 100 MG PO CAPS
100.0000 mg | ORAL_CAPSULE | Freq: Every day | ORAL | Status: DC
  Administered 2013-10-20 – 2013-10-25 (×5): 100 mg via ORAL
  Filled 2013-10-20 (×7): qty 1

## 2013-10-20 MED ORDER — BETAMETHASONE SOD PHOS & ACET 6 (3-3) MG/ML IJ SUSP
12.0000 mg | INTRAMUSCULAR | Status: AC
  Administered 2013-10-20 – 2013-10-21 (×2): 12 mg via INTRAMUSCULAR
  Filled 2013-10-20 (×2): qty 2

## 2013-10-20 NOTE — Progress Notes (Signed)
bedpan

## 2013-10-20 NOTE — Progress Notes (Signed)
Patient ID: Elizabeth Glass, female   DOB: March 25, 1980, 33 y.o.   MRN: [id number] Pt resting comfortably GFM x 2 Ctxs minimal Still leaking clear fluid  VSSAF FHR 140s x 2 Ctxs 1-3x /h mild  [place]  Complete breech presenting twin/transverse head maternal left  Twins PPROM A Stable on Mag 2.5/h BMZ series started Abx IV/po C/S for delivery NICU consult

## 2013-10-20 NOTE — MAU Note (Signed)
Water broke around 0730, pink tinged fluid.  Denies contractions.

## 2013-10-20 NOTE — Progress Notes (Signed)
Order placed to transfer to [facility]. Transfer when bed available.

## 2013-10-20 NOTE — H&P (Signed)
Elizabeth Glass is a 33 y.o. female presenting for PPROM clear fluid at 0730 this morning.  She is pregnant with twins that are Trans/Trans.  Previous c/s for repeat.  Preg otherwise uncomplicated.Marland Kitchen History OB History   Grav Para Term Preterm Abortions TAB SAB Ect Mult Living   Past Medical History  Diagnosis Date  . History of chicken pox    Past Surgical History  Procedure Laterality Date  . Wisdom tooth extraction    . Cesarean section  06/26/2011    Procedure: CESAREAN SECTION;  Surgeon: Meriel Pica, MD;  Location: [facility];  Service: Gynecology;  Laterality: N/A;  Primary cesarean section with delivery of baby girl at 97. Apgars 9/9.   Family History: family history includes Celiac disease in her paternal aunt. Social History:  reports that she has never smoked. She has never used smokeless tobacco. She reports that she does not drink alcohol or use illicit drugs.   Prenatal Transfer Tool  Maternal Diabetes: No Genetic Screening: Normal Maternal Ultrasounds/Referrals: Normal Fetal Ultrasounds or other Referrals:  None Maternal Substance Abuse:  No Significant Maternal Medications:  None Significant Maternal Lab Results:  None Other Comments:  None  ROS  Dilation: 4 Effacement (%): 60 Blood pressure 125/79, pulse 101, temperature 98.5 F (36.9 C), temperature source Oral, height  (1.6 m), weight 67.132 kg (148 lb), SpO2 99.00%, unknown if currently breastfeeding. Exam Physical Exam  Prenatal labs: ABO, Rh:   Antibody:   Rubella:   RPR:    HBsAg:    HIV:    GBS:     Assessment/Plan: Twins at 32 4/7 with PPROM Plan IV abx for PPROM Betamethasone series [place] for confirmation of presentation Magnesium for tocolysis and neuroprotection. Discussed plan of care with family and their questions were answered   Germain Koopmann C 10/20/2013, 10:36 AM

## 2013-10-20 NOTE — Consult Note (Signed)
Asked by Dr.Lowe to provide prenatal consultation for patient at risk for preterm delivery due to PROM at 31 4/[redacted] wks EGA.  Mother is 33 y.o. G2 P1 with twins, otherwise pregnancy was previously uncomplicated. She is being treated with betamethasone, antibiotics, and Mag sulfate.  Discussed usual expectations for preterm infants at 40 - [redacted] weeks gestation, including possible needs for DR resuscitation, respiratory support, IV access, and blood products.  Presented optimistic long term outlook with low risk for death or serious morbidity, projected possible length of stay in NICU until 36 - [redacted] wks EGA.  Discussed advantages of feeding with mother's milk.  She plans to pump postnatally and breast feed.  Patients were attentive, had appropriate questions, and were appreciative of my input.  Thank you for the consultation.  Total time 30 minutes

## 2013-10-21 ENCOUNTER — Inpatient Hospital Stay (HOSPITAL_COMMUNITY): Payer: BC Managed Care – PPO

## 2013-10-21 MED ORDER — LACTATED RINGERS IV SOLN
INTRAVENOUS | Status: DC
  Administered 2013-10-21 – 2013-10-22 (×3): via INTRAVENOUS

## 2013-10-21 NOTE — Progress Notes (Signed)
Called dr Henderson Cloud informing him of difficulty tracing both babies, requesting him to come assess.

## 2013-10-21 NOTE — Progress Notes (Signed)
31 5/7 weeks Weak with magnesium  VSS Afeb Lungs CTA Cor RRR DTR 1+ in UE  FHT accels x 2 UCs none noted  Monitors just readjusted  A/P: Twins breech/transverse         PPROM         BMTZ #2 this am         Continue Magnesium for now         MFM consult         D/W patient and husband above

## 2013-10-21 NOTE — Progress Notes (Signed)
adjusting monitors, babies HR run very close together, will continue to adjust and assess

## 2013-10-21 NOTE — Progress Notes (Signed)
Pt sitting up eating lunch

## 2013-10-21 NOTE — Consult Note (Signed)
Maternal Fetal Medicine Consultation  Requesting Provider(s): Harold Hedge II, MD  Reason for consultation: Twin gestation at 31w 5d, PROM  HPI: Elizabeth Glass is a 33 yo G2P1001, EDD 12/18/2013 currently at [redacted]w[redacted]d with suspected DC/DA twin gestation who presented yesterday morning with complaints of PROM.  Fluid clear - felt to be approximately 4 cm dilated by sterile speculum exam.  At time of admission, fetuses were Breech/ transverse presentation with plans for repeat C-section.  The patient just completed her second dose of betamethasone and has been on Magnesium sulfate for neuroprophylaxis since admission.  Ms. Bocchino was started on Ampicillin and oral Azithromycin for latency.  She is without complaints.  She denies contractions, vaginal bleeding.  Both fetuses are active.  OB History: OB History   Grav Para Term Preterm Abortions TAB SAB Ect Mult Living   C-section for arrest of dilation  PMH:  Past Medical History  Diagnosis Date  . History of chicken pox     PSH:  Past Surgical History  Procedure Laterality Date  . Wisdom tooth extraction    . Cesarean section  06/26/2011    Procedure: CESAREAN SECTION;  Surgeon: Meriel Pica, MD;  Location: [facility];  Service: Gynecology;  Laterality: N/A;  Primary cesarean section with delivery of baby girl at 80. Apgars 9/9.   Meds:  Scheduled Meds: . ampicillin (OMNIPEN) IV  2 g Intravenous Q6H   Followed by  . [START ON 10/22/2013] amoxicillin  500 mg Oral Q8H  . azithromycin  500 mg Oral Daily  . docusate sodium  100 mg Oral Daily  . prenatal multivitamin  1 tablet Oral Q1200   Continuous Infusions: . dextrose 5% lactated ringers 100 mL/hr at 10/21/13 0819   PRN Meds:.acetaminophen, calcium carbonate, zolpidem  Allergies:No Known Allergies  FH: Family History  Problem Relation Age of Onset  . Celiac disease Paternal Aunt    Soc:  History   Social History  . Marital Status: Married    Spouse  Name: N/A    Number of Children: N/A  . Years of Education: N/A   Occupational History  . Not on file.   Social History Main Topics  . Smoking status: Never Smoker   . Smokeless tobacco: Never Used  . Alcohol Use: No  . Drug Use: No  . Sexual Activity: Yes   Other Topics Concern  . Not on file   Social History Narrative  . No narrative on file    PE:   Filed Vitals:   10/21/13 1300  BP:   Pulse:   Temp:   Resp: 18    GEN: well-appearing female ABD: gravid, NT  Labs: CBC    Component Value Date/Time   WBC 10.0 10/20/2013 1030   RBC 3.94 10/20/2013 1030   HGB 12.5 10/20/2013 1030   HCT 35.5* 10/20/2013 1030   PLT 178 10/20/2013 1030   MCV 90.1 10/20/2013 1030   MCH 31.7 10/20/2013 1030   MCHC 35.2 10/20/2013 1030   RDW 14.8 10/20/2013 1030   LYMPHSABS 2.0 10/20/2013 1030   MONOABS 0.8 10/20/2013 1030   EOSABS 0.1 10/20/2013 1030   BASOSABS 0.0 10/20/2013 1030   A/P: 1) DC/DA twin gestation at 31w 5d         2) PROM - concur with course of betamethasone, Latency antibiotics and Magnesium sulfate for neuroprophylaxis- now that the patient has completed her course of betamethasone, would recommend  stopping Magnesium.  In the event that there is a significant clinical change and delivery if felt to be imminent, would consider re-starting Magnesium for neuroprophylaxis - but there is likely no benefit from Magnesium after [redacted] weeks gestation.  Recommend continuing latency antibiotics - may transition to oral Amoxicillin tomorrow for a total (oral + IV) course of 7 days.  Would begin at least daily NSTs (or more frequently if the clinical scenario dictates). In the event the patient does not go into labor or develop chorioamnionitis - would recommend delivery at 34 weeks via Cesarean delivery.   Thank you for the opportunity to be a part of the care of Elizabeth Glass. Please contact our office if we can be of further assistance.   I spent approximately 15 minutes with this patient  with over 50% of time spent in face-to-face counseling.  Alpha Gula, MD Maternal-Fetal Medicine

## 2013-10-21 NOTE — Progress Notes (Signed)
Appreciate Dr Fredda Hammed consultation  D/W patient and husband-will stop magnesium and continue IV fluids and atbs as ordered.  D/W cesarean section with regional or possible general anesthesia and risks including infection, organ damage, bleeding/transfusion-HIV/Hep, DVT/PE, pneumonia, wound breakdown/infection.  All questions answered.

## 2013-10-22 ENCOUNTER — Encounter (HOSPITAL_COMMUNITY)
Admission: AD | Disposition: A | Discharge status: Home / Self Care | Payer: Self-pay | Source: Ambulatory Visit | Attending: Obstetrics and Gynecology

## 2013-10-22 ENCOUNTER — Inpatient Hospital Stay (HOSPITAL_COMMUNITY): Payer: BC Managed Care – PPO | Admitting: Anesthesiology

## 2013-10-22 ENCOUNTER — Encounter (HOSPITAL_COMMUNITY): Payer: Self-pay

## 2013-10-22 ENCOUNTER — Encounter (HOSPITAL_COMMUNITY): Payer: BC Managed Care – PPO | Admitting: Anesthesiology

## 2013-10-22 LAB — CULTURE, BETA STREP (GROUP B ONLY)

## 2013-10-22 SURGERY — Surgical Case
Anesthesia: Spinal

## 2013-10-22 MED ORDER — FENTANYL CITRATE 0.05 MG/ML IJ SOLN
INTRAMUSCULAR | Status: AC
  Filled 2013-10-22: qty 2

## 2013-10-22 MED ORDER — MEPERIDINE HCL 25 MG/ML IJ SOLN: 6.2500 mg | INTRAMUSCULAR | Status: DC | PRN

## 2013-10-22 MED ORDER — DIPHENHYDRAMINE HCL 25 MG PO CAPS: 25.0000 mg | ORAL_CAPSULE | ORAL | Status: DC | PRN

## 2013-10-22 MED ORDER — NALOXONE HCL 0.4 MG/ML IJ SOLN: 0.4000 mg | INTRAMUSCULAR | Status: DC | PRN

## 2013-10-22 MED ORDER — PHENYLEPHRINE 8 MG IN D5W 100 ML (0.08MG/ML) PREMIX OPTIME
INJECTION | INTRAVENOUS | Status: DC | PRN
  Administered 2013-10-22: 60 ug/min via INTRAVENOUS

## 2013-10-22 MED ORDER — KETOROLAC TROMETHAMINE 30 MG/ML IJ SOLN
30.0000 mg | Freq: Four times a day (QID) | INTRAMUSCULAR | Status: AC | PRN
  Administered 2013-10-22: 30 mg via INTRAMUSCULAR

## 2013-10-22 MED ORDER — MORPHINE SULFATE (PF) 0.5 MG/ML IJ SOLN
INTRAMUSCULAR | Status: DC | PRN
  Administered 2013-10-22: .1 mg via INTRATHECAL

## 2013-10-22 MED ORDER — DIPHENHYDRAMINE HCL 50 MG/ML IJ SOLN: 12.5000 mg | INTRAMUSCULAR | Status: DC | PRN

## 2013-10-22 MED ORDER — LACTATED RINGERS IV SOLN
INTRAVENOUS | Status: DC | PRN
  Administered 2013-10-22: 10:00:00 via INTRAVENOUS

## 2013-10-22 MED ORDER — INFLUENZA VAC SPLIT QUAD 0.5 ML IM SUSY
0.5000 mL | PREFILLED_SYRINGE | INTRAMUSCULAR | Status: AC
  Administered 2013-10-23: 0.5 mL via INTRAMUSCULAR
  Filled 2013-10-22: qty 0.5

## 2013-10-22 MED ORDER — FENTANYL CITRATE 0.05 MG/ML IJ SOLN
25.0000 ug | INTRAMUSCULAR | Status: DC | PRN
  Administered 2013-10-22 (×2): 50 ug via INTRAVENOUS

## 2013-10-22 MED ORDER — DIPHENHYDRAMINE HCL 50 MG/ML IJ SOLN: 25.0000 mg | INTRAMUSCULAR | Status: DC | PRN

## 2013-10-22 MED ORDER — SCOPOLAMINE 1 MG/3DAYS TD PT72
MEDICATED_PATCH | TRANSDERMAL | Status: AC
  Filled 2013-10-22: qty 1

## 2013-10-22 MED ORDER — CITRIC ACID-SODIUM CITRATE 334-500 MG/5ML PO SOLN
ORAL | Status: AC
  Administered 2013-10-22: 30 mL
  Filled 2013-10-22: qty 15

## 2013-10-22 MED ORDER — ONDANSETRON HCL 4 MG/2ML IJ SOLN: 4.0000 mg | Freq: Three times a day (TID) | INTRAMUSCULAR | Status: DC | PRN

## 2013-10-22 MED ORDER — METOCLOPRAMIDE HCL 5 MG/ML IJ SOLN: 10.0000 mg | Freq: Three times a day (TID) | INTRAMUSCULAR | Status: DC | PRN

## 2013-10-22 MED ORDER — OXYTOCIN 10 UNIT/ML IJ SOLN
INTRAMUSCULAR | Status: AC
  Filled 2013-10-22: qty 4

## 2013-10-22 MED ORDER — DEXTROSE 5 % IV SOLN
1.0000 ug/kg/h | INTRAVENOUS | Status: DC | PRN
  Filled 2013-10-22: qty 2

## 2013-10-22 MED ORDER — KETOROLAC TROMETHAMINE 30 MG/ML IJ SOLN: 30.0000 mg | Freq: Four times a day (QID) | INTRAMUSCULAR | Status: AC | PRN

## 2013-10-22 MED ORDER — CEFAZOLIN SODIUM-DEXTROSE 2-3 GM-% IV SOLR
INTRAVENOUS | Status: DC | PRN
  Administered 2013-10-22: 2 g via INTRAVENOUS

## 2013-10-22 MED ORDER — NALBUPHINE HCL 10 MG/ML IJ SOLN: 5.0000 mg | INTRAMUSCULAR | Status: DC | PRN

## 2013-10-22 MED ORDER — ONDANSETRON HCL 4 MG/2ML IJ SOLN
INTRAMUSCULAR | Status: AC
  Filled 2013-10-22: qty 2

## 2013-10-22 MED ORDER — FENTANYL CITRATE 0.05 MG/ML IJ SOLN
INTRAMUSCULAR | Status: DC | PRN
  Administered 2013-10-22: 25 ug via INTRAVENOUS
  Administered 2013-10-22: 25 ug via INTRATHECAL
  Administered 2013-10-22: 25 ug via INTRAVENOUS

## 2013-10-22 MED ORDER — KETOROLAC TROMETHAMINE 30 MG/ML IJ SOLN
INTRAMUSCULAR | Status: AC
  Filled 2013-10-22: qty 1

## 2013-10-22 MED ORDER — ONDANSETRON HCL 4 MG/2ML IJ SOLN
INTRAMUSCULAR | Status: DC | PRN
  Administered 2013-10-22: 4 mg via INTRAVENOUS

## 2013-10-22 MED ORDER — SCOPOLAMINE 1 MG/3DAYS TD PT72
1.0000 | MEDICATED_PATCH | Freq: Once | TRANSDERMAL | Status: DC
  Filled 2013-10-22: qty 1

## 2013-10-22 MED ORDER — LACTATED RINGERS IV SOLN
INTRAVENOUS | Status: DC | PRN
  Administered 2013-10-22: 09:00:00 via INTRAVENOUS

## 2013-10-22 MED ORDER — SODIUM CHLORIDE 0.9 % IJ SOLN: 3.0000 mL | INTRAMUSCULAR | Status: DC | PRN

## 2013-10-22 MED ORDER — MORPHINE SULFATE 0.5 MG/ML IJ SOLN
INTRAMUSCULAR | Status: AC
  Filled 2013-10-22: qty 10

## 2013-10-22 MED ORDER — MEPERIDINE HCL 25 MG/ML IJ SOLN
INTRAMUSCULAR | Status: DC | PRN
  Administered 2013-10-22: 12.5 mg via INTRAVENOUS

## 2013-10-22 MED ORDER — PHENYLEPHRINE 8 MG IN D5W 100 ML (0.08MG/ML) PREMIX OPTIME
INJECTION | INTRAVENOUS | Status: AC
  Filled 2013-10-22: qty 100

## 2013-10-22 MED ORDER — MEPERIDINE HCL 25 MG/ML IJ SOLN
INTRAMUSCULAR | Status: AC
  Filled 2013-10-22: qty 1

## 2013-10-22 MED ORDER — BUPIVACAINE HCL (PF) 0.25 % IJ SOLN
INTRAMUSCULAR | Status: AC
  Filled 2013-10-22: qty 30

## 2013-10-22 MED ORDER — OXYTOCIN 10 UNIT/ML IJ SOLN
40.0000 [IU] | INTRAMUSCULAR | Status: DC | PRN
  Administered 2013-10-22: 40 [IU] via INTRAVENOUS

## 2013-10-22 MED ORDER — PHENYLEPHRINE HCL 10 MG/ML IJ SOLN
INTRAMUSCULAR | Status: DC | PRN
  Administered 2013-10-22: 80 ug via INTRAVENOUS

## 2013-10-22 MED ORDER — CEFAZOLIN SODIUM-DEXTROSE 2-3 GM-% IV SOLR
INTRAVENOUS | Status: AC
  Filled 2013-10-22: qty 50

## 2013-10-22 SURGICAL SUPPLY — 39 items
APL SKNCLS STERI-STRIP NONHPOA (GAUZE/BANDAGES/DRESSINGS) ×1
BARRIER ADHS 3X4 INTERCEED (GAUZE/BANDAGES/DRESSINGS) IMPLANT
BENZOIN TINCTURE PRP APPL 2/3 (GAUZE/BANDAGES/DRESSINGS) ×2 IMPLANT
BLADE SURG 10 STRL SS (BLADE) ×6 IMPLANT
BRR ADH 4X3 ABS CNTRL BYND (GAUZE/BANDAGES/DRESSINGS)
CLAMP CORD UMBIL (MISCELLANEOUS) IMPLANT
CLOSURE WOUND 1/2 X4 (GAUZE/BANDAGES/DRESSINGS) ×1
CLOTH BEACON ORANGE TIMEOUT ST (SAFETY) ×3 IMPLANT
CONTAINER PREFILL 10% NBF 15ML (MISCELLANEOUS) IMPLANT
DRAPE LG THREE QUARTER DISP (DRAPES) IMPLANT
DRSG OPSITE POSTOP 4X10 (GAUZE/BANDAGES/DRESSINGS) ×3 IMPLANT
DURAPREP 26ML APPLICATOR (WOUND CARE) ×3 IMPLANT
ELECT REM PT RETURN 9FT ADLT (ELECTROSURGICAL) ×3
ELECTRODE REM PT RTRN 9FT ADLT (ELECTROSURGICAL) ×1 IMPLANT
EXTRACTOR VACUUM M CUP 4 TUBE (SUCTIONS) IMPLANT
EXTRACTOR VACUUM M CUP 4' TUBE (SUCTIONS)
GLOVE BIO SURGEON STRL SZ 6.5 (GLOVE) ×2 IMPLANT
GLOVE BIO SURGEONS STRL SZ 6.5 (GLOVE) ×1
GOWN STRL REUS W/TWL LRG LVL3 (GOWN DISPOSABLE) ×6 IMPLANT
KIT ABG SYR 3ML LUER SLIP (SYRINGE) IMPLANT
NDL HYPO 25X5/8 SAFETYGLIDE (NEEDLE) ×1 IMPLANT
NEEDLE HYPO 22GX1.5 SAFETY (NEEDLE) IMPLANT
NEEDLE HYPO 25X5/8 SAFETYGLIDE (NEEDLE) ×3 IMPLANT
NS IRRIG 1000ML POUR BTL (IV SOLUTION) ×3 IMPLANT
PACK C SECTION WH (CUSTOM PROCEDURE TRAY) ×3 IMPLANT
PAD OB MATERNITY 4.3X12.25 (PERSONAL CARE ITEMS) ×3 IMPLANT
STAPLER VISISTAT 35W (STAPLE) IMPLANT
STRIP CLOSURE SKIN 1/2X4 (GAUZE/BANDAGES/DRESSINGS) ×1 IMPLANT
SUT CHROMIC 0 CTX 36 (SUTURE) ×6 IMPLANT
SUT PLAIN 0 NONE (SUTURE) IMPLANT
SUT PLAIN 2 0 XLH (SUTURE) IMPLANT
SUT VIC AB 0 CT1 27 (SUTURE) ×9
SUT VIC AB 0 CT1 27XBRD ANBCTR (SUTURE) ×3 IMPLANT
SUT VIC AB 4-0 KS 27 (SUTURE) IMPLANT
SYRINGE CONTROL L 12CC (SYRINGE) IMPLANT
SYRINGE CONTROL LL 12CC (SYRINGE) IMPLANT
TOWEL OR 17X24 6PK STRL BLUE (TOWEL DISPOSABLE) ×3 IMPLANT
TRAY FOLEY CATH 14FR (SET/KITS/TRAYS/PACK) ×3 IMPLANT
WATER STERILE IRR 1000ML POUR (IV SOLUTION) ×3 IMPLANT

## 2013-10-22 NOTE — Transfer of Care (Signed)
Immediate Anesthesia Transfer of Care Note  Patient: Elizabeth Glass  Procedure(s) Performed: Procedure(s): CESAREAN SECTION (N/A)  Patient Location: PACU  Anesthesia Type:Spinal  Level of Consciousness: awake, alert  and oriented  Airway & Oxygen Therapy: Patient Spontanous Breathing  Post-op Assessment: Report given to PACU RN and Post -op Vital signs reviewed and stable  Post vital signs: Reviewed and stable  Complications: No apparent anesthesia complications

## 2013-10-22 NOTE — Lactation Note (Signed)
This note was copied from the chart of [name]. Lactation Consultation Note  Patient Name: [name] Today's Date: 10/22/2013 Reason for consult: Other (Comment) (formula for exclusion)   Maternal Data Formula Feeding for Exclusion: Yes (babies in NICU) Reason for exclusion: Mother's choice to formula feed on admision  Feeding    LATCH Score/Interventions                      Lactation Tools Discussed/Used     Consult Status      Elizabeth Glass 10/22/2013, 6:33 PM    

## 2013-10-22 NOTE — Anesthesia Preprocedure Evaluation (Signed)
Anesthesia Evaluation  Patient identified by MRN, date of birth, ID band Patient awake    Reviewed: Allergy & Precautions, H&P , Patient's Chart, lab work & pertinent test results  Airway Mallampati: II TM Distance: >3 FB Neck ROM: full    Dental no notable dental hx.    Pulmonary  breath sounds clear to auscultation  Pulmonary exam normal       Cardiovascular Exercise Tolerance: Good Rhythm:regular Rate:Normal     Neuro/Psych    GI/Hepatic   Endo/Other    Renal/GU      Musculoskeletal   Abdominal   Peds  Hematology   Anesthesia Other Findings   Reproductive/Obstetrics                           Anesthesia Physical Anesthesia Plan  ASA: II and emergent  Anesthesia Plan: Spinal   Post-op Pain Management:    Induction:   Airway Management Planned:   Additional Equipment:   Intra-op Plan:   Post-operative Plan:   Informed Consent: I have reviewed the patients History and Physical, chart, labs and discussed the procedure including the risks, benefits and alternatives for the proposed anesthesia with the patient or authorized representative who has indicated his/her understanding and acceptance.   Dental Advisory Given  Plan Discussed with: CRNA  Anesthesia Plan Comments: (Lab work confirmed with CRNA in room. Platelets okay. Discussed spinal anesthetic, and patient consents to the procedure:  included risk of possible headache,backache, failed block, allergic reaction, and nerve injury. This patient was asked if she had any questions or concerns before the procedure started. )        Anesthesia Quick Evaluation  

## 2013-10-22 NOTE — Consult Note (Signed)
Neonatology Note:   Attendance at C-section:    I was asked by Dr. Vincente Poli to attend this repeat C/S at 31 6/[redacted] weeks GA due to transverse lie and twin gestation. The mother is a G2P1 O pos, GBS pending (9/13) with PPROM since 9/13 at 0730, fluid clear. The patient was admitted and received Ampicillin and Zithromax, changed to Amoxicillin today. She also got 2 doses of Betamethasone 9/13-14 and Magnesium sulfate yesterday. She has been afebrile and there has been no fetal distress. She was dilated to 5 cm this morning, and a cord could be palpated on exam, so opted for C-section delivery to avoid possibility of cord prolapse.  Twin A, a boy, was delivered double footling breech after ROM and a gush of clear fluid (thought to be an intact amniotic sac) and was vigorous with good spontaneous cry and tone. Needed only minimal bulb suctioning. We placed a pulse oximeter for monitoring. At about 6 minutes, he began to retract slightly and his O2 saturation was drifting below target range, so we placed the neopuff on him, with improvement in air exchange and O2 saturations.Ap 8/9. Lungs clear to ausc in DR. Viewed briefly by his parents in the OR, then transported to the NICU for further care, on the neopuff and about 25-30% FIO2.  Twin B, a boy, was delivered vertex (no fluid noted, probably infant with PPROM) and was vigorous with good spontaneous cry and tone. Needed only minimal bulb suctioning. We placed a pulse oximeter for monitoring and, at about 4 minutes, he began to desaturate, so was placed on the neopuff. He responded well, with improvement in air exchange and O2 saturations. Ap 8/9. Lungs clear to ausc in DR. Viewed briefly by his parents in the OR, then transported to the NICU for further care, on the neopuff and about 25-30% FIO2.    Doretha Sou, MD

## 2013-10-22 NOTE — Progress Notes (Signed)
Patient is noting more contractions EXAM 4 to 5 cm dilated Baby A is transverse back up and I can palpate umbilical cord Recommend C Section now because of high risk of cord prolapse Will administer Mag for neuroprophylaxis

## 2013-10-22 NOTE — Op Note (Signed)
Elizabeth Glass, CAPUTO                 ACCOUNT NO.:  [id number]  MEDICAL RECORD NO.:  [id number]  LOCATION:  9309                          FACILITY:  [facility]  PHYSICIAN:  Jaymin Waln L. Malak Duchesneau, M.D.DATE OF BIRTH:  1980/07/11  DATE OF PROCEDURE:  10/22/2013 DATE OF DISCHARGE:                              OPERATIVE REPORT   PREOPERATIVE DIAGNOSES:  Intrauterine pregnancy at 31 weeks and 6 days, PPROM twins previous cesarean section and twin A in transverse position backup.  POSTOPERATIVE DIAGNOSES:  Intrauterine pregnancy at 31 weeks and 6 days, PPROM twins previous cesarean section and twin A in transverse position backup and PPROM of baby B.  PROCEDURE:  Repeat low transverse cesarean section.  SURGEON:  Gregory Dowe L. Vincente Poli, M.D.  ANESTHESIA:  Spinal.  ESTIMATED BLOOD LOSS:  Less than 500.  COMPLICATIONS:  None.  PATHOLOGY:  Placenta.  DESCRIPTION OF PROCEDURE:  The patient was taken to the operating room. Her spinal was placed.  She was prepped and draped in usual sterile fashion.  The Foley catheter was already inserted.  Low transverse incision was made carried down to the fascia.  Fascia scored in the midline and extended laterally.  The rectus muscles were separated in the midline.  The peritoneum was then entered bluntly.  Peritoneal incision was then stretched.  The lower uterine segment was identified and the bladder flap was created sharply and then digitally.  The bladder blade was then readjusted.  A low transverse incision was made in the uterus and uterus was entered using a hemostat.  The baby A sac was actually intact and AROM revealed clear fluid.  The baby was in transverse position and was delivered easily via complete breech extraction.  The cord was clamped and cut.  The baby was handed to the awaiting pediatricians.  Baby B was came down vertex and I could tell that there was no amniotic fluid in baby B's sac, so I think the PPROM occurred from baby B's sac and  the baby was delivered easily.  Inverse presentation, both babies were female infants.  The Neonatal team took care of them and they subsequently went to the NICU for prematurity. The placenta was manually removed, noted to be normal. Intact.  The uterus was cleared of all clots and debris.  The uterine incision was closed in 2 layers using 0 chromic in a running, locked stitch. Peritoneum was closed using 0 Vicryl.  The fascia was closed using 0 Vicryl running stitch x2 starting each corner meeting in the midline.  The skin was closed using a 4-0 Vicryl on the subcuticular. All sponge, lap, and instrument counts were correct x2.  Steri-Strips and a honeycomb dressing were applied and the patient went to recovery room in stable condition.     Elliett Guarisco L. Vincente Poli, M.D.     Florestine Avers  D:  10/22/2013  T:  10/22/2013  Job:  [id number]

## 2013-10-22 NOTE — Anesthesia Postprocedure Evaluation (Signed)
  Anesthesia Post-op Note  Patient: Elizabeth Glass  Procedure(s) Performed: Procedure(s): CESAREAN SECTION (N/A)  Patient is awake, responsive, moving her legs, and has signs of resolution of her numbness. Pain and nausea are reasonably well controlled. Vital signs are stable and clinically acceptable. Oxygen saturation is clinically acceptable. There are no apparent anesthetic complications at this time. Patient is ready for discharge.

## 2013-10-22 NOTE — Anesthesia Procedure Notes (Signed)

## 2013-10-22 NOTE — Brief Op Note (Signed)
10/20/2013 - 10/22/2013  10:03 AM  PATIENT:  Elizabeth Glass  33 y.o. female  PRE-OPERATIVE DIAGNOSIS:  IUP at 34 w 6 days PPROM Previous C Section Advanced Dilation Twins Baby A in Transverse position back up  POST-OPERATIVE DIAGNOSIS:   Same PPROM of Baby B  PROCEDURE:  Procedure(s): CESAREAN SECTION (N/A)  SURGEON:  Surgeon(s) and Role:    * Jeani Hawking, MD - Primary  PHYSICIAN ASSISTANT:   ASSISTANTS: none   ANESTHESIA:   spinal  EBL:  Total I/O In: 1150 [I.V.:1150] Out: 850 [Urine:50; Blood:800]  BLOOD ADMINISTERED:none  DRAINS: Urinary Catheter (Foley)   LOCAL MEDICATIONS USED:  NONE  SPECIMEN:  Source of Specimen:  placentas  DISPOSITION OF SPECIMEN:  PATHOLOGY  COUNTS:  YES  TOURNIQUET:  * No tourniquets in log *  DICTATION:dictated  PLAN OF CARE: Admit to inpatient   PATIENT DISPOSITION:  PACU - hemodynamically stable.   Delay start of Pharmacological VTE agent (>24hrs) due to surgical blood loss or risk of bleeding: not applicable

## 2013-10-23 ENCOUNTER — Encounter (HOSPITAL_COMMUNITY): Payer: Self-pay | Admitting: *Deleted

## 2013-10-23 MED ORDER — IBUPROFEN 800 MG PO TABS
800.0000 mg | ORAL_TABLET | Freq: Three times a day (TID) | ORAL | Status: DC | PRN
  Administered 2013-10-23 – 2013-10-25 (×7): 800 mg via ORAL
  Filled 2013-10-23 (×7): qty 1

## 2013-10-23 MED ORDER — OXYCODONE-ACETAMINOPHEN 5-325 MG PO TABS
1.0000 | ORAL_TABLET | ORAL | Status: DC | PRN
  Administered 2013-10-24 – 2013-10-25 (×3): 1 via ORAL
  Filled 2013-10-23 (×4): qty 1

## 2013-10-23 NOTE — Progress Notes (Signed)
Subjective: Postpartum Day 1: Cesarean Delivery, twins Patient reports incisional pain, tolerating PO and no problems voiding.   Babies are in NICU  Objective: Vital signs in last 24 hours: Temp:  [97.6 F (36.4 C)-99.4 F (37.4 C)] 99.3 F (37.4 C) (09/16 1000) Pulse Rate:  [54-81] 71 (09/16 1000) Resp:  [18-25] 18 (09/16 1000) BP: (97-120)/(53-74) 113/55 mmHg (09/16 1000) SpO2:  [96 %-100 %] 98 % (09/16 1000) Weight:  [67.132 kg (148 lb)] 67.132 kg (148 lb) (09/15 1230)  Physical Exam:  General: alert and cooperative Lochia: appropriate Uterine Fundus: firm Incision: healing well, no significant drainage DVT Evaluation: No evidence of DVT seen on physical exam.   Recent Labs  10/20/13 1030  HGB 12.5  HCT 35.5*    Assessment/Plan: Status post Cesarean section. Doing well postoperatively.  Continue current care.  Elizabeth Glass 10/23/2013, 10:14 AM

## 2013-10-23 NOTE — Progress Notes (Signed)
Ur chart review completed.  

## 2013-10-23 NOTE — Anesthesia Postprocedure Evaluation (Signed)
  Anesthesia Post-op Note  Patient: Elizabeth Glass  Procedure(s) Performed: Procedure(s): CESAREAN SECTION (N/A)  Patient Location: [facility]  Anesthesia Type:Spinal  Level of Consciousness: awake, alert , oriented and patient cooperative  Airway and Oxygen Therapy: Patient Spontanous Breathing  Post-op Pain: mild  Post-op Assessment: Patient's Cardiovascular Status Stable, Respiratory Function Stable, Pain level controlled, No headache, No backache, No residual numbness and No residual motor weakness  Post-op Vital Signs: stable  Last Vitals:  Filed Vitals:   10/23/13 0535  BP: 104/60  Pulse: 67  Temp: 36.8 C  Resp: 18    Complications: No apparent anesthesia complications

## 2013-10-23 NOTE — Addendum Note (Signed)
Addendum created 10/23/13 1007 by Earmon Phoenix, CRNA   Modules edited: Notes Section   Notes Section:  File: [id number]

## 2013-10-24 LAB — CBC
HEMATOCRIT: 32.1 % — AB (ref 36.0–46.0)
HEMOGLOBIN: 10.9 g/dL — AB (ref 12.0–15.0)
MCH: 31.9 pg (ref 26.0–34.0)
MCHC: 34 g/dL (ref 30.0–36.0)
MCV: 93.9 fL (ref 78.0–100.0)
Platelets: 171 10*3/uL (ref 150–400)
RBC: 3.42 MIL/uL — ABNORMAL LOW (ref 3.87–5.11)
RDW: 15.1 % (ref 11.5–15.5)
WBC: 12.9 10*3/uL — ABNORMAL HIGH (ref 4.0–10.5)

## 2013-10-24 NOTE — Progress Notes (Signed)
CSW attempted to meet with MOB in her 3rd floor room to introduce myself and offer support, but she was not in her room at this time.  CSW will attempt again at a later time.

## 2013-10-24 NOTE — Progress Notes (Signed)
Subjective: Postpartum Day two: Cesarean Delivery Patient reports tolerating PO, + BM and no problems voiding.    Objective: Vital signs in last 24 hours: Temp:  [98.2 F (36.8 C)-99.3 F (37.4 C)] 98.4 F (36.9 C) (09/17 0602) Pulse Rate:  [63-81] 78 (09/17 0602) Resp:  [17-18] 17 (09/17 0602) BP: (103-125)/(55-72) 125/72 mmHg (09/17 0602) SpO2:  [98 %-100 %] 99 % (09/17 0602)  Physical Exam:  General: alert Lochia: appropriate Uterine Fundus: firm Incision: healing well DVT Evaluation: No evidence of DVT seen on physical exam.  No results found for this basename: HGB, HCT,  in the last 72 hours  Assessment/Plan: Status post Cesarean section. Doing well postoperatively.  Continue current care.  Summer Parthasarathy S 10/24/2013, 9:19 AM

## 2013-10-25 MED ORDER — OXYCODONE-ACETAMINOPHEN 5-325 MG PO TABS: 1.0000 | ORAL_TABLET | ORAL | Status: DC | PRN

## 2013-10-25 MED ORDER — IBUPROFEN 800 MG PO TABS: 800.0000 mg | ORAL_TABLET | Freq: Three times a day (TID) | ORAL | Status: DC | PRN

## 2013-10-25 NOTE — Progress Notes (Signed)
UR chart review completed.  

## 2013-10-25 NOTE — Discharge Summary (Signed)
Obstetric Discharge Summary Reason for Admission: rupture of membranes Prenatal Procedures: ultrasound Intrapartum Procedures: cesarean: low cervical, transverse Postpartum Procedures: none Complications-Operative and Postpartum: none Hemoglobin  Date Value Ref Range Status  10/24/2013 10.9* 12.0 - 15.0 g/dL Final     HCT  Date Value Ref Range Status  10/24/2013 32.1* 36.0 - 46.0 % Final    Physical Exam:  General: alert and cooperative Lochia: appropriate Uterine Fundus: firm Incision: healing well DVT Evaluation: No evidence of DVT seen on physical exam. Negative Homan's sign. No cords or calf tenderness. No significant calf/ankle edema.  Discharge Diagnoses: s/p cesarean delivery of twins@ 31 weeks  Discharge Information: Date: 10/25/2013 Activity: pelvic rest Diet: routine Medications: PNV, Ibuprofen and Percocet Condition: stable Instructions: refer to practice specific booklet Discharge to: home   Newborn Data:   Archie, Atilano [[id number]]  Live born female  Birth Weight: 3 lb 7 oz (1559 g) APGAR: 8, 9   Laurence, Folz [[id number]]  Live born female  Birth Weight: 2 lb 9.6 oz (1180 g) APGAR: 8, 9  Home with babies in NICU.  Sowmya Partridge G 10/25/2013, 9:17 AM

## 2013-10-25 NOTE — Progress Notes (Signed)
Fadumo and her husband appear to be coping well.  They understand that staying in the NICU is the best thing for their babies right now, but their hearts are heavy to be leaving their babies here when they go home.  At the same time, they are eager to get back to their 35 year old daughter.  I provided reflective listening and gave them a space to share their mixed emotions.  We will continue to provide follow up care as we see them in the NICU, but please also page as needs arise.  [employer] Pager, [phone] 1:11 PM

## 2013-10-25 NOTE — Progress Notes (Signed)
CSW attempted to meet with parents to complete assessment for NICU admission of twins at 32 weeks, but they were not in MOB's room.  CSW checked at bedside, but MOB was holding both babies, receiving an update from NNP, and had a visitor.  Therefore, CSW did not feel it was an ideal time to talk.  CSW will attempt again at a later time.

## 2013-10-29 ENCOUNTER — Other Ambulatory Visit: Payer: BC Managed Care – PPO

## 2013-12-09 ENCOUNTER — Encounter (HOSPITAL_COMMUNITY): Payer: Self-pay | Admitting: *Deleted

## 2013-12-09 ENCOUNTER — Other Ambulatory Visit: Payer: Self-pay | Admitting: Obstetrics and Gynecology

## 2013-12-10 LAB — CYTOLOGY - PAP

## 2015-01-13 ENCOUNTER — Ambulatory Visit (INDEPENDENT_AMBULATORY_CARE_PROVIDER_SITE_OTHER): Payer: BLUE CROSS/BLUE SHIELD | Admitting: Physician Assistant

## 2015-01-13 ENCOUNTER — Encounter: Payer: Self-pay | Admitting: Physician Assistant

## 2015-01-13 VITALS — BP 110/58 | HR 81 | Temp 98.2°F | Ht 64.0 in | Wt 134.6 lb

## 2015-01-13 DIAGNOSIS — J208 Acute bronchitis due to other specified organisms: Secondary | ICD-10-CM

## 2015-01-13 DIAGNOSIS — B9689 Other specified bacterial agents as the cause of diseases classified elsewhere: Secondary | ICD-10-CM

## 2015-01-13 DIAGNOSIS — H578 Other specified disorders of eye and adnexa: Secondary | ICD-10-CM | POA: Diagnosis not present

## 2015-01-13 DIAGNOSIS — H5789 Other specified disorders of eye and adnexa: Secondary | ICD-10-CM | POA: Insufficient documentation

## 2015-01-13 DIAGNOSIS — J00 Acute nasopharyngitis [common cold]: Secondary | ICD-10-CM | POA: Diagnosis not present

## 2015-01-13 MED ORDER — HYDROCODONE-HOMATROPINE 5-1.5 MG/5ML PO SYRP: 5.0000 mL | ORAL_SOLUTION | Freq: Four times a day (QID) | ORAL | Status: DC | PRN

## 2015-01-13 NOTE — Progress Notes (Signed)
Pre visit review using our clinic review tool, if applicable. No additional management support is needed unless otherwise documented below in the visit note. 

## 2015-01-13 NOTE — Assessment & Plan Note (Signed)
Resolved with irrigation of eye. No residual symptoms or signs. Supportive measures reviewed.

## 2015-01-13 NOTE — Patient Instructions (Signed)
Please keep moisturizing drops in the eye over the next couple of days. You should be ok as long as there is no more shampoo in the eyes.  Take antibiotic (Azithromycin) as directed.  Increase fluids.  Get plenty of rest. Use Mucinex for congestion. Use Hycodan for nighttime cough. Take a daily probiotic (I recommend Align or Culturelle, but even Activia Yogurt may be beneficial).  A humidifier placed in the bedroom may offer some relief for a dry, scratchy throat of nasal irritation.  Read information below on acute bronchitis. Please call or return to clinic if symptoms are not improving.  Acute Bronchitis Bronchitis is when the airways that extend from the windpipe into the lungs get red, puffy, and painful (inflamed). Bronchitis often causes thick spit (mucus) to develop. This leads to a cough. A cough is the most common symptom of bronchitis. In acute bronchitis, the condition usually begins suddenly and goes away over time (usually in 2 weeks). Smoking, allergies, and asthma can make bronchitis worse. Repeated episodes of bronchitis may cause more lung problems.  HOME CARE  Rest.  Drink enough fluids to keep your pee (urine) clear or pale yellow (unless you need to limit fluids as told by your doctor).  Only take over-the-counter or prescription medicines as told by your doctor.  Avoid smoking and secondhand smoke. These can make bronchitis worse. If you are a smoker, think about using nicotine gum or skin patches. Quitting smoking will help your lungs heal faster.  Reduce the chance of getting bronchitis again by:  Washing your hands often.  Avoiding people with cold symptoms.  Trying not to touch your hands to your mouth, nose, or eyes.  Follow up with your doctor as told.  GET HELP IF: Your symptoms do not improve after 1 week of treatment. Symptoms include:  Cough.  Fever.  Coughing up thick spit.  Body aches.  Chest congestion.  Chills.  Shortness of  breath.  Sore throat.  GET HELP RIGHT AWAY IF:   You have an increased fever.  You have chills.  You have severe shortness of breath.  You have bloody thick spit (sputum).  You throw up (vomit) often.  You lose too much body fluid (dehydration).  You have a severe headache.  You faint.  MAKE SURE YOU:   Understand these instructions.  Will watch your condition.  Will get help right away if you are not doing well or get worse. Document Released: 07/13/2007 Document Revised: 09/26/2012 Document Reviewed: 07/17/2012 [facility] Patient Information 2015 [employer], [place]. This information is not intended to replace advice given to you by your health care provider. Make sure you discuss any questions you have with your health care provider.

## 2015-01-13 NOTE — Assessment & Plan Note (Signed)
Lung exam within normal limits. Finish Azithromycin. Increase fluids. Continue Mucinex-DM. Rx tussionex for nighttime cough. Follow-up Friday as scheduled.

## 2015-01-13 NOTE — Progress Notes (Signed)
    Patient presents to clinic today c/o 3-4 weeks of chest congestion and productive cough. Denies fever, chills, chest pain or SOB. Was seen at UC recently and started Azithromycin 4 days ago. Notes some improvement in symptoms but cough is still present.  Patient also endorses redness and irritation of R eye after getting shampoo in the eye last night while showering. Noted redness and irritation has resolved since early this morning. Would like assessment.  Past Medical History  Diagnosis Date  . History of chicken pox     Current Outpatient Prescriptions on File Prior to Visit  Medication Sig Dispense Refill  . Prenatal Vit-Fe Fumarate-FA (PRENATAL MULTIVITAMIN) TABS Take 1 tablet by mouth at bedtime.     No current facility-administered medications on file prior to visit.    No Known Allergies  Family History  Problem Relation Age of Onset  . Celiac disease Paternal Aunt     Social History   Social History  . Marital Status: Married    Spouse Name: N/A  . Number of Children: N/A  . Years of Education: N/A   Social History Main Topics  . Smoking status: Never Smoker   . Smokeless tobacco: Never Used  . Alcohol Use: No  . Drug Use: No  . Sexual Activity: Yes   Other Topics Concern  . None   Social History Narrative   Review of Systems  Constitutional: Negative for fever and malaise/fatigue.  Eyes: Positive for pain and redness. Negative for blurred vision and double vision.  Respiratory: Positive for cough and sputum production. Negative for hemoptysis, shortness of breath and wheezing.   Cardiovascular: Negative for chest pain and palpitations.   BP 110/58 mmHg  Pulse 81  Temp(Src) 98.2 F (36.8 C) (Oral)  Ht 5\' 4"  (1.626 m)  Wt 134 lb 9.6 oz (61.054 kg)  BMI 23.09 kg/m2  SpO2 98%  LMP 01/04/2015  Breastfeeding? No  Physical Exam  Constitutional: She is oriented to person, place, and time.  HENT:  Head: Normocephalic and atraumatic.  Right Ear:  External ear normal.  Left Ear: External ear normal.  Nose: Nose normal.  Mouth/Throat: Oropharynx is clear and moist. No oropharyngeal exudate.  Eyes: Conjunctivae are normal. Pupils are equal, round, and reactive to light.  Neck: Neck supple.  Cardiovascular: Normal rate, regular rhythm, normal heart sounds and intact distal pulses.   Pulmonary/Chest: Effort normal and breath sounds normal. No respiratory distress. She has no wheezes. She has no rales. She exhibits no tenderness.  Neurological: She is alert and oriented to person, place, and time.  Skin: Skin is warm and dry. No rash noted.  Psychiatric: Affect normal.  Vitals reviewed.   No results found for this or any previous visit (from the past 2160 hour(s)).  Assessment/Plan: Irritation of right eye Resolved with irrigation of eye. No residual symptoms or signs. Supportive measures reviewed.  Acute bacterial bronchitis Lung exam within normal limits. Finish Azithromycin. Increase fluids. Continue Mucinex-DM. Rx tussionex for nighttime cough. Follow-up Friday as scheduled.

## 2015-01-16 ENCOUNTER — Ambulatory Visit: Payer: BLUE CROSS/BLUE SHIELD | Admitting: Physician Assistant

## 2023-02-20 ENCOUNTER — Ambulatory Visit
Admission: RE | Admit: 2023-02-20 | Discharge: 2023-02-20 | Disposition: A | Discharge status: Home / Self Care | Payer: BC Managed Care – PPO | Source: Ambulatory Visit | Attending: Family Medicine | Admitting: Family Medicine

## 2023-02-20 VITALS — BP 126/77 | HR 87 | Temp 97.6°F | Resp 16

## 2023-02-20 DIAGNOSIS — S0502XA Injury of conjunctiva and corneal abrasion without foreign body, left eye, initial encounter: Secondary | ICD-10-CM | POA: Diagnosis not present

## 2023-02-20 MED ORDER — ERYTHROMYCIN 5 MG/GM OP OINT: TOPICAL_OINTMENT | OPHTHALMIC | 0 refills | Status: DC

## 2023-02-20 NOTE — Discharge Instructions (Signed)
 You were seen today for eye irritation.  You appear to have an area of corneal irritation/abrasion.  I have sent out an eye ointment to use for this.  Please follow up or be seen by an eye specialist if this is not improving.

## 2023-02-20 NOTE — ED Triage Notes (Signed)
 Pt presents with left eye irration and sensitivity with some drainage since last night.

## 2023-02-20 NOTE — ED Provider Notes (Signed)
 [facility]    CSN: [id number] Arrival date & time: 02/20/23  9057      History   Chief Complaint Chief Complaint  Patient presents with   Eye Problem    Pink eye? - Entered by patient   Conjunctivitis    HPI Elizabeth Glass is a 43 y.o. female.    Eye Problem Associated symptoms: discharge and redness   Conjunctivitis  Patient is here for left eye issues.  Last night she noted some discomfort to the eye.  The eye is red, and tender.  Some drainage.  Super irritated.  Mostly at the lower part of the eye.  No known injury.  She does not wear contacts.         Past Medical History:  Diagnosis Date   History of chicken pox     Patient Active Problem List   Diagnosis Date Noted   Acute bacterial bronchitis 01/13/2015   Irritation of right eye 01/13/2015   Preterm premature rupture of membranes (PPROM) delivered, current hospitalization 10/20/2013    Past Surgical History:  Procedure Laterality Date   CESAREAN SECTION  06/26/2011   Procedure: CESAREAN SECTION;  Surgeon: Charlie CHRISTELLA Croak, MD;  Location: [facility];  Service: Gynecology;  Laterality: N/A;  Primary cesarean section with delivery of baby girl at 22. Apgars 9/9.   CESAREAN SECTION N/A 10/22/2013   Procedure: CESAREAN SECTION;  Surgeon: Rosaline LITTIE Cobble, MD;  Location: [facility];  Service: Obstetrics;  Laterality: N/A;   WISDOM TOOTH EXTRACTION      OB History     Gravida  2   Para  2   Term  1   Preterm  1   AB      Living  3      SAB      IAB      Ectopic      Multiple  1   Live Births  3            Home Medications    Prior to Admission medications   Medication Sig Start Date End Date Taking? Authorizing Provider  HYDROcodone -homatropine (HYCODAN) 5-1.5 MG/5ML syrup Take 5 mLs by mouth every 6 (six) hours as needed for cough. Patient not taking: Reported on 02/20/2023 01/13/15   Gladis Elsie BROCKS, PA-C  Prenatal Vit-Fe Fumarate-FA (PRENATAL MULTIVITAMIN) TABS  Take 1 tablet by mouth at bedtime.    [provider]    Family History Family History  Problem Relation Age of Onset   Celiac disease Paternal Aunt     Social History Social History   Tobacco Use   Smoking status: Never   Smokeless tobacco: Never  Substance Use Topics   Alcohol use: No   Drug use: No     Allergies   Patient has no known allergies.   Review of Systems Review of Systems  Constitutional: Negative.   HENT: Negative.    Eyes:  Positive for pain, discharge and redness.  Respiratory: Negative.    Cardiovascular: Negative.   Gastrointestinal: Negative.   Musculoskeletal: Negative.   Psychiatric/Behavioral: Negative.       Physical Exam Triage Vital Signs ED Triage Vitals  Encounter Vitals Group     BP 02/20/23 0947 126/77     Systolic BP Percentile --      Diastolic BP Percentile --      Pulse Rate 02/20/23 0947 87     Resp 02/20/23 0947 16     Temp 02/20/23 0947 97.6 F (  36.4 C)     Temp Source 02/20/23 0947 Oral     SpO2 02/20/23 0947 99 %     Weight --      Height --      Head Circumference --      Peak Flow --      Pain Score 02/20/23 0951 4     Pain Loc --      Pain Education --      Exclude from Growth Chart --    No data found.  Updated Vital Signs BP 126/77 (BP Location: Right Arm)   Pulse 87   Temp 97.6 F (36.4 C) (Oral)   Resp 16   LMP 02/06/2023 (Approximate)   SpO2 99%   Visual Acuity Right Eye Distance:   Left Eye Distance:   Bilateral Distance:    Right Eye Near:   Left Eye Near:    Bilateral Near:     Physical Exam Constitutional:      Appearance: Normal appearance.  Eyes:     Comments: There is slight redness/irritation to the left lower sclera;  conjuctiva is slightly injected;  Flourescein stain shows a small area of uptake to the lower lateral sclera;   no FB is noted  Cardiovascular:     Rate and Rhythm: Normal rate and regular rhythm.  Pulmonary:     Effort: Pulmonary effort is normal.      Breath sounds: Normal breath sounds.  Musculoskeletal:     Cervical back: Normal range of motion.  Neurological:     General: No focal deficit present.     Mental Status: She is alert.  Psychiatric:        Mood and Affect: Mood normal.      UC Treatments / Results  Labs (all labs ordered are listed, but only abnormal results are displayed) Labs Reviewed - No data to display  EKG   Radiology No results found.  Procedures Procedures (including critical care time)  Medications Ordered in UC Medications - No data to display  Initial Impression / Assessment and Plan / UC Course  I have reviewed the triage vital signs and the nursing notes.  Pertinent labs & imaging results that were available during my care of the patient were reviewed by me and considered in my medical decision making (see chart for details).   Final Clinical Impressions(s) / UC Diagnoses   Final diagnoses:  Abrasion of left cornea, initial encounter     Discharge Instructions      You were seen today for eye irritation.  You appear to have an area of corneal irritation/abrasion.  I have sent out an eye ointment to use for this.  Please follow up or be seen by an eye specialist if this is not improving.     ED Prescriptions     Medication Sig Dispense Auth. Provider   erythromycin  ophthalmic ointment Place a 1/2 inch ribbon of ointment into the lower eyelid 5x/day x 7 days 3.5 g Darral Longs, MD      PDMP not reviewed this encounter.   Darral Longs, MD 02/20/23 1007

## 2023-10-17 ENCOUNTER — Other Ambulatory Visit: Payer: Self-pay | Admitting: Obstetrics and Gynecology

## 2023-10-17 DIAGNOSIS — R928 Other abnormal and inconclusive findings on diagnostic imaging of breast: Secondary | ICD-10-CM

## 2023-10-24 ENCOUNTER — Ambulatory Visit
Admission: RE | Admit: 2023-10-24 | Discharge: 2023-10-24 | Disposition: A | Discharge status: Home / Self Care | Source: Ambulatory Visit | Attending: Obstetrics and Gynecology | Admitting: Obstetrics and Gynecology

## 2023-10-24 DIAGNOSIS — R928 Other abnormal and inconclusive findings on diagnostic imaging of breast: Secondary | ICD-10-CM

## 2023-10-25 ENCOUNTER — Other Ambulatory Visit: Payer: Self-pay | Admitting: Obstetrics and Gynecology

## 2023-10-25 DIAGNOSIS — N6489 Other specified disorders of breast: Secondary | ICD-10-CM

## 2024-02-19 ENCOUNTER — Other Ambulatory Visit: Payer: Self-pay

## 2024-02-19 ENCOUNTER — Ambulatory Visit
Admission: RE | Admit: 2024-02-19 | Discharge: 2024-02-19 | Disposition: A | Discharge status: Home / Self Care | Attending: Family Medicine | Admitting: Family Medicine

## 2024-02-19 VITALS — BP 127/85 | HR 91 | Temp 98.0°F | Resp 20 | Ht 64.0 in | Wt 135.0 lb

## 2024-02-19 DIAGNOSIS — N3000 Acute cystitis without hematuria: Secondary | ICD-10-CM | POA: Diagnosis not present

## 2024-02-19 LAB — POCT URINALYSIS DIP (MANUAL ENTRY)
Bilirubin, UA: NEGATIVE
Blood, UA: NEGATIVE
Glucose, UA: 100 mg/dL — AB
Ketones, POC UA: NEGATIVE mg/dL
Nitrite, UA: POSITIVE — AB
Protein Ur, POC: 30 mg/dL — AB
Spec Grav, UA: 1.015
Urobilinogen, UA: 1 U/dL
pH, UA: 7

## 2024-02-19 MED ORDER — CEFDINIR 300 MG PO CAPS: 300.0000 mg | ORAL_CAPSULE | Freq: Two times a day (BID) | ORAL | 0 refills | Status: AC

## 2024-02-19 NOTE — Discharge Instructions (Addendum)
 Advised patient take medication as directed with food to completion.  Encouraged increase daily water intake to 64 ounces per day while taking this medication.  Advised we will follow-up with your urine culture results once received.  Advised if symptoms worsen and are unresolved please follow-up with your PCP, [facility] urology, or here for further evaluation.

## 2024-02-19 NOTE — ED Triage Notes (Signed)
 Pt presenting with c/o urinary frequency, burning on urination and soreness to her pelvic area x 1 day. Pt stated that she took an AZO this AM with minimal effectiveness.

## 2024-02-19 NOTE — ED Provider Notes (Signed)
 " Elizabeth Glass    CSN: [id number] Arrival date & time: 02/19/24  1451      History   Chief Complaint Chief Complaint  Patient presents with   Urinary Frequency    Pain - Entered by patient    HPI Elizabeth Glass is a 44 y.o. female.   HPI  Past Medical History:  Diagnosis Date   History of chicken pox     Patient Active Problem List   Diagnosis Date Noted   Acute bacterial bronchitis 01/13/2015   Irritation of right eye 01/13/2015   Preterm premature rupture of membranes (PPROM) delivered, current hospitalization 10/20/2013    Past Surgical History:  Procedure Laterality Date   CESAREAN SECTION  06/26/2011   Procedure: CESAREAN SECTION;  Surgeon: Charlie CHRISTELLA Croak, MD;  Location: [facility];  Service: Gynecology;  Laterality: N/A;  Primary cesarean section with delivery of baby girl at 57. Apgars 9/9.   CESAREAN SECTION N/A 10/22/2013   Procedure: CESAREAN SECTION;  Surgeon: Rosaline LITTIE Cobble, MD;  Location: [facility];  Service: Obstetrics;  Laterality: N/A;   WISDOM TOOTH EXTRACTION      OB History     Gravida  2   Para  2   Term  1   Preterm  1   AB      Living  3      SAB      IAB      Ectopic      Multiple  1   Live Births  3            Home Medications    Prior to Admission medications  Medication Sig Start Date End Date Taking? Authorizing Provider  cefdinir  (OMNICEF ) 300 MG capsule Take 1 capsule (300 mg total) by mouth 2 (two) times daily for 7 days. 02/19/24 02/26/24 Yes Teddy Sharper, FNP    Family History Family History  Problem Relation Age of Onset   Celiac disease Paternal Aunt     Social History Social History[1]   Allergies   Patient has no known allergies.   Review of Systems Review of Systems  Genitourinary:  Positive for frequency.  All other systems reviewed and are negative.    Physical Exam Triage Vital Signs ED Triage Vitals  Encounter Vitals Group     BP      Girls Systolic BP Percentile       Girls Diastolic BP Percentile      Boys Systolic BP Percentile      Boys Diastolic BP Percentile      Pulse      Resp      Temp      Temp src      SpO2      Weight      Height      Head Circumference      Peak Flow      Pain Score      Pain Loc      Pain Education      Exclude from Growth Chart    No data found.  Updated Vital Signs BP 127/85 (BP Location: Right Arm)   Pulse 91   Temp 98 F (36.7 C) (Oral)   Resp 20   Ht 5' 4 (1.626 m)   Wt 135 lb (61.2 kg)   LMP 01/28/2024 (Exact Date)   SpO2 100%   BMI 23.17 kg/m   Visual Acuity Right Eye Distance:   Left Eye Distance:   Bilateral Distance:  Right Eye Near:   Left Eye Near:    Bilateral Near:     Physical Exam Vitals and nursing note reviewed.  Constitutional:      Appearance: Normal appearance. She is normal weight.  HENT:     Head: Normocephalic and atraumatic.     Mouth/Throat:     Mouth: Mucous membranes are moist.     Pharynx: Oropharynx is clear.  Eyes:     Extraocular Movements: Extraocular movements intact.     Conjunctiva/sclera: Conjunctivae normal.     Pupils: Pupils are equal, round, and reactive to light.  Cardiovascular:     Rate and Rhythm: Normal rate and regular rhythm.     Pulses: Normal pulses.     Heart sounds: Normal heart sounds.  Pulmonary:     Effort: Pulmonary effort is normal.     Breath sounds: Normal breath sounds.  Musculoskeletal:        General: Normal range of motion.     Cervical back: Normal range of motion and neck supple.  Skin:    General: Skin is warm and dry.  Neurological:     General: No focal deficit present.     Mental Status: She is alert and oriented to person, place, and time. Mental status is at baseline.  Psychiatric:        Mood and Affect: Mood normal.        Behavior: Behavior normal.      UC Treatments / Results  Labs (all labs ordered are listed, but only abnormal results are displayed) Labs Reviewed  POCT URINALYSIS DIP  (MANUAL ENTRY) - Abnormal; Notable for the following components:      Result Value   Color, UA orange (*)    Clarity, UA hazy (*)    Glucose, UA =100 (*)    Protein Ur, POC =30 (*)    Nitrite, UA Positive (*)    Leukocytes, UA Large (3+) (*)    All other components within normal limits  URINE CULTURE    EKG   Radiology No results found.  Procedures Procedures (including critical Glass time)  Medications Ordered in UC Medications - No data to display  Initial Impression / Assessment and Plan / UC Course  I have reviewed the triage vital signs and the nursing notes.  Pertinent labs & imaging results that were available during my Glass of the patient were reviewed by me and considered in my medical decision making (see chart for details).     MDM: 1.  Acute cystitis without hematuria-UA revealed above, urine culture ordered, Rx'd cefdinir  300 mg capsule: Take 1 capsule twice daily x 7 days. Advised patient take medication as directed with food to completion.  Encouraged increase daily water intake to 64 ounces per day while taking this medication.  Advised we will follow-up with your urine culture results once received.  Advised if symptoms worsen and are unresolved please follow-up with your PCP, [facility] urology, or here for further evaluation.  Patient discharged home, hemodynamically stable. Final Clinical Impressions(s) / UC Diagnoses   Final diagnoses:  Acute cystitis without hematuria     Discharge Instructions      Advised patient take medication as directed with food to completion.  Encouraged increase daily water intake to 64 ounces per day while taking this medication.  Advised we will follow-up with your urine culture results once received.  Advised if symptoms worsen and are unresolved please follow-up with your PCP, [facility] urology, or here for  further evaluation.     ED Prescriptions     Medication Sig Dispense Auth. Provider   cefdinir  (OMNICEF ) 300  MG capsule Take 1 capsule (300 mg total) by mouth 2 (two) times daily for 7 days. 14 capsule Elnor Renovato, FNP      PDMP not reviewed this encounter.    [1]  Social History Tobacco Use   Smoking status: Never   Smokeless tobacco: Never  Substance Use Topics   Alcohol use: No   Drug use: No     Teddy Sharper, FNP 02/19/24 1540  "

## 2024-02-21 ENCOUNTER — Ambulatory Visit (HOSPITAL_COMMUNITY): Payer: Self-pay

## 2024-02-21 LAB — URINE CULTURE: Culture: 20000 — AB

## 2024-03-05 ENCOUNTER — Ambulatory Visit: Admitting: Physician Assistant

## 2024-03-06 ENCOUNTER — Other Ambulatory Visit: Payer: Self-pay

## 2024-03-06 ENCOUNTER — Ambulatory Visit
Admission: RE | Admit: 2024-03-06 | Discharge: 2024-03-06 | Disposition: A | Discharge status: Home / Self Care | Attending: Family Medicine | Admitting: Family Medicine

## 2024-03-06 VITALS — BP 135/83 | HR 119 | Temp 99.8°F | Resp 22 | Ht 64.0 in | Wt 135.0 lb

## 2024-03-06 DIAGNOSIS — J111 Influenza due to unidentified influenza virus with other respiratory manifestations: Secondary | ICD-10-CM | POA: Diagnosis not present

## 2024-03-06 HISTORY — DX: Psoriasis, unspecified: L40.9

## 2024-03-06 LAB — POCT INFLUENZA A/B
Influenza A, POC: NEGATIVE
Influenza B, POC: POSITIVE — AB

## 2024-03-06 LAB — POC SOFIA SARS ANTIGEN FIA: SARS Coronavirus 2 Ag: NEGATIVE

## 2024-03-06 MED ORDER — BENZONATATE 100 MG PO CAPS: 100.0000 mg | ORAL_CAPSULE | Freq: Three times a day (TID) | ORAL | 0 refills | Status: AC

## 2024-03-06 MED ORDER — OSELTAMIVIR PHOSPHATE 75 MG PO CAPS: 75.0000 mg | ORAL_CAPSULE | Freq: Two times a day (BID) | ORAL | 0 refills | Status: AC

## 2024-03-06 NOTE — ED Triage Notes (Signed)
 Cough x 1 week, worse last night. Also has c/o nasal congestion, sore throat, pain with coughing. Feeling tired as well. Cough is productive but of small amounts of yellow/green sputum. Not tested for covid/flu.

## 2024-03-06 NOTE — Discharge Instructions (Signed)
 You tested positive for influenza today.  I have sent out tamiflu  to treat this, as well as a medication to help with cough.  Please get rest and fluids.  You may use tylenol /motrin  for fevers and body aches.  Return if you are not improving or worsening.

## 2024-03-06 NOTE — ED Provider Notes (Signed)
 " Elizabeth Glass    CSN: [id number] Arrival date & time: 03/06/24  0848      History   Chief Complaint Chief Complaint  Patient presents with   Cough    Entered by patient    HPI Elizabeth Glass is a 44 y.o. female.    Cough Associated symptoms: fever and rhinorrhea   Associated symptoms: no shortness of breath and no wheezing    Patient is here for URI symptoms.  Started with sinus congestion and slight cough about 1 week ago.  However, yesterday she started feeling much worse with cough, chest pain with coughing.  Low grade fevers of 99.  No wheezing or sob noted.  Taking flonase, and nyquil last night.  Her kids had a cough and dx with a sinus infection, but that was weeks ago.        Past Medical History:  Diagnosis Date   History of chicken pox    Psoriasis     Patient Active Problem List   Diagnosis Date Noted   Acute bacterial bronchitis 01/13/2015   Irritation of right eye 01/13/2015   Preterm premature rupture of membranes (PPROM) delivered, current hospitalization 10/20/2013    Past Surgical History:  Procedure Laterality Date   CESAREAN SECTION  06/26/2011   Procedure: CESAREAN SECTION;  Surgeon: Charlie CHRISTELLA Croak, MD;  Location: [facility];  Service: Gynecology;  Laterality: N/A;  Primary cesarean section with delivery of baby girl at 49. Apgars 9/9.   CESAREAN SECTION N/A 10/22/2013   Procedure: CESAREAN SECTION;  Surgeon: Rosaline LITTIE Cobble, MD;  Location: [facility];  Service: Obstetrics;  Laterality: N/A;   WISDOM TOOTH EXTRACTION      OB History     Gravida  2   Para  2   Term  1   Preterm  1   AB      Living  3      SAB      IAB      Ectopic      Multiple  1   Live Births  3            Home Medications    Prior to Admission medications  Medication Sig Start Date End Date Taking? Authorizing Provider  fluticasone (FLONASE) 50 MCG/ACT nasal spray Place into both nostrils daily.   Yes [provider]     Family History Family History  Problem Relation Age of Onset   Celiac disease Paternal Aunt     Social History Social History[1]   Allergies   Patient has no known allergies.   Review of Systems Review of Systems  Constitutional:  Positive for fatigue and fever.  HENT:  Positive for congestion and rhinorrhea.   Respiratory:  Positive for cough. Negative for shortness of breath and wheezing.   Gastrointestinal: Negative.   Genitourinary: Negative.   Musculoskeletal: Negative.   Psychiatric/Behavioral: Negative.       Physical Exam Triage Vital Signs ED Triage Vitals  Encounter Vitals Group     BP 03/06/24 0859 135/83     Girls Systolic BP Percentile --      Girls Diastolic BP Percentile --      Boys Systolic BP Percentile --      Boys Diastolic BP Percentile --      Pulse Rate 03/06/24 0859 (!) 119     Resp 03/06/24 0859 (!) 22     Temp 03/06/24 0859 99.8 F (37.7 C)     Temp src --  SpO2 03/06/24 0859 100 %     Weight 03/06/24 0902 135 lb (61.2 kg)     Height 03/06/24 0902 5' 4 (1.626 m)     Head Circumference --      Peak Flow --      Pain Score 03/06/24 0902 6     Pain Loc --      Pain Education --      Exclude from Growth Chart --    No data found.  Updated Vital Signs BP 135/83   Pulse (!) 119   Temp 99.8 F (37.7 C)   Resp (!) 22   Ht 5' 4 (1.626 m)   Wt 61.2 kg   LMP 02/21/2024 (Approximate)   SpO2 100%   BMI 23.17 kg/m   Visual Acuity Right Eye Distance:   Left Eye Distance:   Bilateral Distance:    Right Eye Near:   Left Eye Near:    Bilateral Near:     Physical Exam Constitutional:      General: She is not in acute distress.    Appearance: Normal appearance. She is normal weight. She is ill-appearing. She is not toxic-appearing.  HENT:     Nose: Congestion and rhinorrhea present.     Right Sinus: No maxillary sinus tenderness or frontal sinus tenderness.     Left Sinus: No maxillary sinus tenderness or frontal sinus  tenderness.     Mouth/Throat:     Mouth: Mucous membranes are moist.  Cardiovascular:     Rate and Rhythm: Normal rate and regular rhythm.  Pulmonary:     Effort: Pulmonary effort is normal.     Breath sounds: Normal breath sounds. No wheezing or rhonchi.  Musculoskeletal:     Cervical back: Normal range of motion and neck supple. No tenderness.  Lymphadenopathy:     Cervical: No cervical adenopathy.  Skin:    General: Skin is warm.  Neurological:     General: No focal deficit present.     Mental Status: She is alert.  Psychiatric:        Mood and Affect: Mood normal.      UC Treatments / Results  Labs (all labs ordered are listed, but only abnormal results are displayed) Labs Reviewed  POCT INFLUENZA A/B - Abnormal; Notable for the following components:      Result Value   Influenza B, POC Positive (*)    All other components within normal limits  POC SOFIA SARS ANTIGEN FIA    EKG   Radiology No results found.  Procedures Procedures (including critical Glass time)  Medications Ordered in UC Medications - No data to display  Initial Impression / Assessment and Plan / UC Course  I have reviewed the triage vital signs and the nursing notes.  Pertinent labs & imaging results that were available during my Glass of the patient were reviewed by me and considered in my medical decision making (see chart for details).   Final Clinical Impressions(s) / UC Diagnoses   Final diagnoses:  Influenza with respiratory manifestation     Discharge Instructions      You tested positive for influenza today.  I have sent out tamiflu  to treat this, as well as a medication to help with cough.  Please get rest and fluids.  You may use tylenol /motrin  for fevers and body aches.  Return if you are not improving or worsening.     ED Prescriptions     Medication Sig Dispense Auth. Provider  oseltamivir  (TAMIFLU ) 75 MG capsule Take 1 capsule (75 mg total) by mouth every 12  (twelve) hours. 10 capsule Jelesa Mangini, MD   benzonatate  (TESSALON ) 100 MG capsule Take 1 capsule (100 mg total) by mouth every 8 (eight) hours. 21 capsule Darral Longs, MD      PDMP not reviewed this encounter.    [1]  Social History Tobacco Use   Smoking status: Never   Smokeless tobacco: Never  Substance Use Topics   Alcohol use: No   Drug use: No     Darral Longs, MD 03/06/24 [phone]  "

## 2024-04-02 ENCOUNTER — Ambulatory Visit: Admitting: Physician Assistant

## 2024-04-24 ENCOUNTER — Encounter
